# Patient Record
Sex: Female | Born: 1995
Health system: Southern US, Community
[De-identification: ages and names within clinical notes are randomized; demographics above are authoritative.]

## PROBLEM LIST (undated history)

## (undated) DIAGNOSIS — J45909 Unspecified asthma, uncomplicated: Secondary | ICD-10-CM

## (undated) DIAGNOSIS — F32A Depression, unspecified: Secondary | ICD-10-CM

## (undated) DIAGNOSIS — F329 Major depressive disorder, single episode, unspecified: Secondary | ICD-10-CM

## (undated) DIAGNOSIS — F419 Anxiety disorder, unspecified: Secondary | ICD-10-CM

## (undated) HISTORY — DX: Major depressive disorder, single episode, unspecified: F32.9

## (undated) HISTORY — DX: Depression, unspecified: F32.A

## (undated) HISTORY — DX: Anxiety disorder, unspecified: F41.9

## (undated) HISTORY — DX: Unspecified asthma, uncomplicated: J45.909

---

## 2016-03-26 ENCOUNTER — Ambulatory Visit (INDEPENDENT_AMBULATORY_CARE_PROVIDER_SITE_OTHER): Payer: 59 | Admitting: Family Medicine

## 2016-03-26 VITALS — BP 130/80 | HR 83 | Temp 98.4°F | Resp 16 | Ht 68.0 in | Wt 211.0 lb

## 2016-03-26 DIAGNOSIS — Z6832 Body mass index (BMI) 32.0-32.9, adult: Secondary | ICD-10-CM

## 2016-03-26 DIAGNOSIS — F329 Major depressive disorder, single episode, unspecified: Secondary | ICD-10-CM | POA: Insufficient documentation

## 2016-03-26 DIAGNOSIS — F321 Major depressive disorder, single episode, moderate: Secondary | ICD-10-CM | POA: Diagnosis not present

## 2016-03-26 DIAGNOSIS — Z113 Encounter for screening for infections with a predominantly sexual mode of transmission: Secondary | ICD-10-CM

## 2016-03-26 DIAGNOSIS — F411 Generalized anxiety disorder: Secondary | ICD-10-CM

## 2016-03-26 DIAGNOSIS — Z114 Encounter for screening for human immunodeficiency virus [HIV]: Secondary | ICD-10-CM | POA: Diagnosis not present

## 2016-03-26 DIAGNOSIS — E6609 Other obesity due to excess calories: Secondary | ICD-10-CM | POA: Diagnosis not present

## 2016-03-26 DIAGNOSIS — Z0000 Encounter for general adult medical examination without abnormal findings: Secondary | ICD-10-CM | POA: Diagnosis not present

## 2016-03-26 DIAGNOSIS — N898 Other specified noninflammatory disorders of vagina: Secondary | ICD-10-CM

## 2016-03-26 DIAGNOSIS — F32A Depression, unspecified: Secondary | ICD-10-CM | POA: Insufficient documentation

## 2016-03-26 LAB — LIPID PANEL
CHOL/HDL RATIO: 3 ratio (ref <=5.0)
CHOLESTEROL: 163 mg/dL (ref 125–200)
HDL: 55 mg/dL (ref >=46)
LDL Cholesterol: 87 mg/dL (ref <130)
Triglycerides: 104 mg/dL (ref <150)
VLDL: 21 mg/dL (ref <30)

## 2016-03-26 LAB — CBC
HEMATOCRIT: 41.4 % (ref 35.0–45.0)
HEMOGLOBIN: 14.2 g/dL (ref 11.7–15.5)
MCH: 28.6 pg (ref 27.0–33.0)
MCHC: 34.3 g/dL (ref 32.0–36.0)
MCV: 83.5 fL (ref 80.0–100.0)
MPV: 9.1 fL (ref 7.5–12.5)
Platelets: 259 10*3/uL (ref 140–400)
RBC: 4.96 MIL/uL (ref 3.80–5.10)
RDW: 12.7 % (ref 11.0–15.0)
WBC: 7.3 10*3/uL (ref 3.8–10.8)

## 2016-03-26 LAB — POCT WET + KOH PREP
TRICH BY WET PREP: ABSENT
Yeast by KOH: ABSENT
Yeast by wet prep: ABSENT

## 2016-03-26 LAB — TSH: TSH: 0.77 mIU/L

## 2016-03-26 MED ORDER — METRONIDAZOLE 500 MG PO TABS: 500.0000 mg | ORAL_TABLET | Freq: Two times a day (BID) | ORAL | 0 refills | Status: DC

## 2016-03-26 NOTE — Patient Instructions (Addendum)
   IF you received an x-ray today, you will receive an invoice from [facility]. Please contact  Radiology at [phone] with questions or concerns regarding your invoice.   IF you received labwork today, you will receive an invoice from [facility]. Please contact [facility] at [phone] with questions or concerns regarding your invoice.   Our billing staff will not be able to assist you with questions regarding bills from these companies.  You will be contacted with the lab results as soon as they are available. The fastest way to get your results is to activate your My Chart account. Instructions are located on the last page of this paperwork. If you have not heard from us regarding the results in 2 weeks, please contact this office.     Health Maintenance, Female Adopting a healthy lifestyle and getting preventive care can go a long way to promote health and wellness. Talk with your health care provider about what schedule of regular examinations is right for you. This is a good chance for you to check in with your provider about disease prevention and staying healthy. In between checkups, there are plenty of things you can do on your own. Experts have done a lot of research about which lifestyle changes and preventive measures are most likely to keep you healthy. Ask your health care provider for more information. WEIGHT AND DIET  Eat a healthy diet  Be sure to include plenty of vegetables, fruits, low-fat dairy products, and lean protein.  Do not eat a lot of foods high in solid fats, added sugars, or salt.  Get regular exercise. This is one of the most important things you can do for your health.  Most adults should exercise for at least 150 minutes each week. The exercise should increase your heart rate and make you sweat (moderate-intensity exercise).  Most adults should also do strengthening exercises at least twice a week. This  is in addition to the moderate-intensity exercise.  Maintain a healthy weight  Body mass index (BMI) is a measurement that can be used to identify possible weight problems. It estimates body fat based on height and weight. Your health care provider can help determine your BMI and help you achieve or maintain a healthy weight.  For females 20 years of age and older:   A BMI below 18.5 is considered underweight.  A BMI of 18.5 to 24.9 is normal.  A BMI of 25 to 29.9 is considered overweight.  A BMI of 30 and above is considered obese.  Watch levels of cholesterol and blood lipids  You should start having your blood tested for lipids and cholesterol at 22 years of age, then have this test every 5 years.  You may need to have your cholesterol levels checked more often if:  Your lipid or cholesterol levels are high.  You are older than 22 years of age.  You are at high risk for heart disease.  CANCER SCREENING   Lung Cancer  Lung cancer screening is recommended for adults 55-80 years old who are at high risk for lung cancer because of a history of smoking.  A yearly low-dose CT scan of the lungs is recommended for people who:  Currently smoke.  Have quit within the past 15 years.  Have at least a 30-pack-year history of smoking. A pack year is smoking an average of one pack of cigarettes a day for 1 year.  Yearly screening should continue until it has been   15 years since you quit.  Yearly screening should stop if you develop a health problem that would prevent you from having lung cancer treatment.  Breast Cancer  Practice breast self-awareness. This means understanding how your breasts normally appear and feel.  It also means doing regular breast self-exams. Let your health care provider know about any changes, no matter how small.  If you are in your 20s or 30s, you should have a clinical breast exam (CBE) by a health care provider every 1-3 years as part of a  regular health exam.  If you are 40 or older, have a CBE every year. Also consider having a breast X-ray (mammogram) every year.  If you have a family history of breast cancer, talk to your health care provider about genetic screening.  If you are at high risk for breast cancer, talk to your health care provider about having an MRI and a mammogram every year.  Breast cancer gene (BRCA) assessment is recommended for women who have family members with BRCA-related cancers. BRCA-related cancers include:  Breast.  Ovarian.  Tubal.  Peritoneal cancers.  Results of the assessment will determine the need for genetic counseling and BRCA1 and BRCA2 testing. Cervical Cancer Your health care provider may recommend that you be screened regularly for cancer of the pelvic organs (ovaries, uterus, and vagina). This screening involves a pelvic examination, including checking for microscopic changes to the surface of your cervix (Pap test). You may be encouraged to have this screening done every 3 years, beginning at age 21.  For women ages 30-65, health care providers may recommend pelvic exams and Pap testing every 3 years, or they may recommend the Pap and pelvic exam, combined with testing for human papilloma virus (HPV), every 5 years. Some types of HPV increase your risk of cervical cancer. Testing for HPV may also be done on women of any age with unclear Pap test results.  Other health care providers may not recommend any screening for nonpregnant women who are considered low risk for pelvic cancer and who do not have symptoms. Ask your health care provider if a screening pelvic exam is right for you.  If you have had past treatment for cervical cancer or a condition that could lead to cancer, you need Pap tests and screening for cancer for at least 20 years after your treatment. If Pap tests have been discontinued, your risk factors (such as having a new sexual partner) need to be reassessed to  determine if screening should resume. Some women have medical problems that increase the chance of getting cervical cancer. In these cases, your health care provider may recommend more frequent screening and Pap tests. Colorectal Cancer  This type of cancer can be detected and often prevented.  Routine colorectal cancer screening usually begins at 22 years of age and continues through 22 years of age.  Your health care provider may recommend screening at an earlier age if you have risk factors for colon cancer.  Your health care provider may also recommend using home test kits to check for hidden blood in the stool.  A small camera at the end of a tube can be used to examine your colon directly (sigmoidoscopy or colonoscopy). This is done to check for the earliest forms of colorectal cancer.  Routine screening usually begins at age 50.  Direct examination of the colon should be repeated every 5-10 years through 22 years of age. However, you may need to be screened more often if   early forms of precancerous polyps or small growths are found. Skin Cancer  Check your skin from head to toe regularly.  Tell your health care provider about any new moles or changes in moles, especially if there is a change in a mole's shape or color.  Also tell your health care provider if you have a mole that is larger than the size of a pencil eraser.  Always use sunscreen. Apply sunscreen liberally and repeatedly throughout the day.  Protect yourself by wearing long sleeves, pants, a wide-brimmed hat, and sunglasses whenever you are outside. HEART DISEASE, DIABETES, AND HIGH BLOOD PRESSURE   High blood pressure causes heart disease and increases the risk of stroke. High blood pressure is more likely to develop in:  People who have blood pressure in the high end of the normal range (130-139/85-89 mm Hg).  People who are overweight or obese.  People who are African American.  If you are 18-39 years of  age, have your blood pressure checked every 3-5 years. If you are 40 years of age or older, have your blood pressure checked every year. You should have your blood pressure measured twice--once when you are at a hospital or clinic, and once when you are not at a hospital or clinic. Record the average of the two measurements. To check your blood pressure when you are not at a hospital or clinic, you can use:  An automated blood pressure machine at a pharmacy.  A home blood pressure monitor.  If you are between 55 years and 79 years old, ask your health care provider if you should take aspirin to prevent strokes.  Have regular diabetes screenings. This involves taking a blood sample to check your fasting blood sugar level.  If you are at a normal weight and have a low risk for diabetes, have this test once every three years after 22 years of age.  If you are overweight and have a high risk for diabetes, consider being tested at a younger age or more often. PREVENTING INFECTION  Hepatitis B  If you have a higher risk for hepatitis B, you should be screened for this virus. You are considered at high risk for hepatitis B if:  You were born in a country where hepatitis B is common. Ask your health care provider which countries are considered high risk.  Your parents were born in a high-risk country, and you have not been immunized against hepatitis B (hepatitis B vaccine).  You have HIV or AIDS.  You use needles to inject street drugs.  You live with someone who has hepatitis B.  You have had sex with someone who has hepatitis B.  You get hemodialysis treatment.  You take certain medicines for conditions, including cancer, organ transplantation, and autoimmune conditions. Hepatitis C  Blood testing is recommended for:  Everyone born from 1945 through 1965.  Anyone with known risk factors for hepatitis C. Sexually transmitted infections (STIs)  You should be screened for sexually  transmitted infections (STIs) including gonorrhea and chlamydia if:  You are sexually active and are younger than 22 years of age.  You are older than 22 years of age and your health care provider tells you that you are at risk for this type of infection.  Your sexual activity has changed since you were last screened and you are at an increased risk for chlamydia or gonorrhea. Ask your health care provider if you are at risk.  If you do not have HIV, but   are at risk, it may be recommended that you take a prescription medicine daily to prevent HIV infection. This is called pre-exposure prophylaxis (PrEP). You are considered at risk if:  You are sexually active and do not regularly use condoms or know the HIV status of your partner(s).  You take drugs by injection.  You are sexually active with a partner who has HIV. Talk with your health care provider about whether you are at high risk of being infected with HIV. If you choose to begin PrEP, you should first be tested for HIV. You should then be tested every 3 months for as long as you are taking PrEP.  PREGNANCY   If you are premenopausal and you may become pregnant, ask your health care provider about preconception counseling.  If you may become pregnant, take 400 to 800 micrograms (mcg) of folic acid every day.  If you want to prevent pregnancy, talk to your health care provider about birth control (contraception). OSTEOPOROSIS AND MENOPAUSE   Osteoporosis is a disease in which the bones lose minerals and strength with aging. This can result in serious bone fractures. Your risk for osteoporosis can be identified using a bone density scan.  If you are 57 years of age or older, or if you are at risk for osteoporosis and fractures, ask your health care provider if you should be screened.  Ask your health care provider whether you should take a calcium or vitamin D supplement to lower your risk for osteoporosis.  Menopause may have  certain physical symptoms and risks.  Hormone replacement therapy may reduce some of these symptoms and risks. Talk to your health care provider about whether hormone replacement therapy is right for you.  HOME CARE INSTRUCTIONS   Schedule regular health, dental, and eye exams.  Stay current with your immunizations.   Do not use any tobacco products including cigarettes, chewing tobacco, or electronic cigarettes.  If you are pregnant, do not drink alcohol.  If you are breastfeeding, limit how much and how often you drink alcohol.  Limit alcohol intake to no more than 1 drink per day for nonpregnant women. One drink equals 12 ounces of beer, 5 ounces of wine, or 1 ounces of hard liquor.  Do not use street drugs.  Do not share needles.  Ask your health care provider for help if you need support or information about quitting drugs.  Tell your health care provider if you often feel depressed.  Tell your health care provider if you have ever been abused or do not feel safe at home.   This information is not intended to replace advice given to you by your health care provider. Make sure you discuss any questions you have with your health care provider.   Document Released: 12/20/2010 Document Revised: 06/27/2014 Document Reviewed: 05/08/2013 Elsevier Interactive Patient Education 2016 [employer]. Suicidal Feelings: How to Help Yourself Suicide is the taking of one's own life. If you feel as though life is getting too tough to handle and are thinking about suicide, get help right away. To get help:  Call your local emergency services (911 in the [place]  Call a suicide hotline to speak with a trained counselor who understands how you are feeling. The following is a list of suicide hotlines in the [place]. For a list of hotlines in [place], visit FindSkins.pl.  1-800-273-TALK (754)539-2887).  1-800-SUICIDE  516-589-2039).  364-323-2648. This is a hotline for Spanish speakers.  9-211-941-7EYC 832-658-1944). This is a hotline  for TTY users.  1-866-4-U-TREVOR (331) 806-3521). This is a hotline for lesbian, gay, bisexual, transgender, or questioning youth.  Contact a crisis center or a local suicide prevention center. To find a crisis center or suicide prevention center:  Call your local hospital, clinic, community service organization, mental health center, social service provider, or health department. Ask for assistance in connecting to a crisis center.  Visit BankingRep.com.au for a list of crisis centers in the [place], or visit www.suicideprevention.ca/thinking-about-suicide/find-a-crisis-centre for a list of centers in [place].  Visit the following websites:  National Suicide Prevention Lifeline: www.suicidepreventionlifeline.org  Hopeline: www.hopeline.[place] for Suicide Prevention: PromotionalLoans.co.za  [employer] (for lesbian, gay, bisexual, transgender, or questioning youth): www.thetrevorproject.org HOW CAN I HELP MYSELF FEEL BETTER?  Promise yourself that you will not do anything drastic when you have suicidal feelings. Remember, there is hope. Many people have gotten through suicidal thoughts and feelings, and you will, too. You may have gotten through them before, and this proves that you can get through them again.  Let family, friends, teachers, or counselors know how you are feeling. Try not to isolate yourself from those who care about you. Remember, they will want to help you. Talk with someone every day, even if you do not feel sociable. Face-to-face conversation is best.  Call a mental health professional and see one regularly.  Visit your primary health care provider every year.  Eat a well-balanced diet, and space your meals so you eat regularly.  Get plenty of rest.  Avoid alcohol and  drugs, and remove them from your home. They will only make you feel worse.  If you are thinking of taking a lot of medicine, give your medicine to someone who can give it to you one day at a time. If you are on antidepressants and are concerned you will overdose, let your health care provider know so he or she can give you safer medicines. Ask your mental health professional about the possible side effects of any medicines you are taking.  Remove weapons, poisons, knives, and anything else that could harm you from your home.  Try to stick to routines. Follow a schedule every day. Put self-care on your schedule.  Make a list of realistic goals, and cross them off when you achieve them. Accomplishments give a sense of worth.  Wait until you are feeling better before doing the things you find difficult or unpleasant.  Exercise if you are able. You will feel better if you exercise for even a half hour each day.  Go out in the sun or into nature. This will help you recover from depression faster. If you have a favorite place to walk, go there.  Do the things that have always given you pleasure. Play your favorite music, read a good book, paint a picture, play your favorite instrument, or do anything else that takes your mind off your depression if it is safe to do.  Keep your living space well lit.  When you are feeling well, write yourself a letter about tips and support that you can read when you are not feeling well.  Remember that life's difficulties can be sorted out with help. Conditions can be treated. You can work on thoughts and strategies that serve you well.   This information is not intended to replace advice given to you by your health care provider. Make sure you discuss any questions you have with your health care provider.   Document Released: 12/11/2002 Document  Revised: 06/27/2014 Document Reviewed: 10/01/2013 Elsevier Interactive Patient Education [employer].

## 2016-03-26 NOTE — Progress Notes (Signed)
Chief Complaint  Patient presents with  . Annual Exam    pap  . depression screen    pos    Subjective:  Julia Huang is a 20 y.o. female here for a health maintenance visit.  Patient is established pt Additional concerns addressed today:  Depression Reports long standing history of irritability Her parents have a history of anxiety and depression with bipolar disorder in her father with anxiety with her mother.  She reports that she gets suicidal thoughts that she handles by staying close to family for that time. She does not have active suicidal thoughts.  She has not addressed this before.   HPI   Patient Active Problem List   Diagnosis Date Noted  . Generalized anxiety disorder 03/26/2016  . Depression 03/26/2016    Past Medical History:  Diagnosis Date  . Anxiety   . Asthma   . Depression     No past surgical history on file.    No outpatient prescriptions prior to visit.   No facility-administered medications prior to visit.     No Known Allergies   Family History  Problem Relation Age of Onset  . Mental illness Mother   . Diabetes Father   . Hyperlipidemia Father   . Mental illness Father      Health Habits: Dental Exam: not up to date Eye Exam: up to date Exercise: 0 times/week on average Current exercise activities: none Diet: balances cooking at home with eating out  Social History   Social History  . Marital status: Married    Spouse name: N/A  . Number of children: N/A  . Years of education: N/A   Occupational History  . Not on file.   Social History Main Topics  . Smoking status: Never Smoker  . Smokeless tobacco: Not on file  . Alcohol use Not on file  . Drug use: Unknown  . Sexual activity: Not on file   Other Topics Concern  . Not on file   Social History Narrative  . No narrative on file   History  Alcohol use Not on file   History  Smoking Status  . Never Smoker  Smokeless Tobacco  . Not on file    History  Drug use: Unknown    GYN: Sexual Health Menstrual status: regular menses LMP: Patient's last menstrual period was 03/08/2016 (approximate). Last pap smear: 2016 History of abnormal pap smears: no  Sexually active: yes with female partner Current contraception: condoms  Health Maintenance: See under health Maintenance activity for review of completion dates as well.  There is no immunization history on file for this patient.  Health Maintenance reviewed    Depression Screen-PHQ2/9 Depression screen [facility] 2/9 03/26/2016  Decreased Interest 1  Down, Depressed, Hopeless 3  PHQ - 2 Score 4  Altered sleeping 1  Tired, decreased energy 3  Change in appetite 0  Feeling bad or failure about yourself  2  Trouble concentrating 3  Moving slowly or fidgety/restless 1  Suicidal thoughts 1  PHQ-9 Score 15     Denies active plan Reports that she does not get thoughts daily  Depression Severity and Treatment Recommendations:  0-4= None  5-9= Mild / Treatment: Support, educate to call if worse; return in one month  10-14= Moderate / Treatment: Support, watchful waiting; Antidepressant or Psycotherapy  15-19= Moderately severe / Treatment: Antidepressant OR Psychotherapy  >= 20 = Major depression, severe / Antidepressant AND Psychotherapy    Review of Systems   ROS  See HPI as well for ROS   Objective:   Vitals:   03/26/16 1411  BP: 130/80  Pulse: 83  Resp: 16  Temp: 98.4 F (36.9 C)  TempSrc: Oral  SpO2: 96%  Weight: 211 lb (95.7 kg)  Height: _0  (1.727 m)   Body mass index is 32.08 kg/m.  Physical Exam  Constitutional: She is oriented to person, place, and time. She appears well-developed and well-nourished.  HENT:  Head: Normocephalic and atraumatic.  Right Ear: External ear normal.  Left Ear: External ear normal.  Eyes: Conjunctivae are normal. Pupils are equal, round, and reactive to light.  Cardiovascular: Normal rate, regular rhythm and  normal heart sounds.   No murmur heard. Pulmonary/Chest: Effort normal and breath sounds normal. No respiratory distress. She has no wheezes.  Abdominal: Soft. Bowel sounds are normal. She exhibits no distension. There is no tenderness. There is no rebound.  Genitourinary: Uterus normal. Vaginal discharge found.  Musculoskeletal: Normal range of motion. She exhibits no edema.  Neurological: She is alert and oriented to person, place, and time. No cranial nerve deficit.  Skin: Skin is warm. She is not diaphoretic. No erythema.  Psychiatric: She has a normal mood and affect. Her behavior is normal. Thought content normal.      Assessment/Plan:   Patient was seen for a health maintenance exam.  Counseled the patient on health maintenance issues. Reviewed her health mainteance schedule and ordered appropriate tests (see orders.) Counseled on regular exercise and weight management. Recommend regular eye exams and dental cleaning.   The following issues were addressed today for health maintenance: Screening for cervical cancer - advised pt to get pap smear next year Contraception - advised pt to follow up if she decides to get other options for contraception Vaccinations - HPV reportedly uptodate, declined flu vaccine   Julia Huang was seen today for annual exam and depression screen.  Diagnoses and all orders for this visit:  Moderate single current episode of major depressive disorder ([place])- new diagnosis, referral to [place] suicide hotline Passive suicidal ideation at this time Will follow up with patient in one month Will screen for underlying metabolic causes -     CBC -     TSH  Generalized anxiety disorder - pt with elements of anxiety as well Given her family history of bipolar disorder in her father evaluation by [facility] will help to clarify her status  Screen for STD (sexually transmitted disease) -     GC/Chlamydia Probe Amp -     HIV antibody  Encounter for  screening for HIV- verbal consent given for HIV screening  Health maintenance examination- will check for dyslipidemia based on BMI, anemia and thyroid disease -     CBC -     TSH -     Lipid panel  Class 1 obesity due to excess calories with serious comorbidity and body mass index (BMI) of 32.0 to 32.9 in adult- discussed exercise 5 times a week and dietary changes such as limiting sweet beverages -     Lipid panel  Vaginal discharge- consistent with BV GC/Ch pending Treated with Flagyl and probiotic Result of wet prep reviewed in the office -     GC/Chlamydia Probe Amp -     POCT Wet + KOH Prep      Return in about 4 weeks (around 04/23/2016) for depression follow up.  No future appointments.  Body mass index is 32.08 kg/m.:  Discussed the patient's BMI with patient.  The BMI above normal - obese range  Patient Instructions       IF you received an x-ray today, you will receive an invoice from [facility]. Please contact [facility] at [phone] with questions or concerns regarding your invoice.   IF you received labwork today, you will receive an invoice from [employer]. Please contact [facility] at [phone] with questions or concerns regarding your invoice.   Our billing staff will not be able to assist you with questions regarding bills from these companies.  You will be contacted with the lab results as soon as they are available. The fastest way to get your results is to activate your My Chart account. Instructions are located on the last page of this paperwork. If you have not heard from [place] regarding the results in 2 weeks, please contact this office.     Health Maintenance, Female Adopting a healthy lifestyle and getting preventive care can go a long way to promote health and wellness. Talk with your health care provider about what schedule of regular examinations is right for you. This is a good chance for you to  check in with your provider about disease prevention and staying healthy. In between checkups, there are plenty of things you can do on your own. Experts have done a lot of research about which lifestyle changes and preventive measures are most likely to keep you healthy. Ask your health care provider for more information. WEIGHT AND DIET  Eat a healthy diet  Be sure to include plenty of vegetables, fruits, low-fat dairy products, and lean protein.  Do not eat a lot of foods high in solid fats, added sugars, or salt.  Get regular exercise. This is one of the most important things you can do for your health.  Most adults should exercise for at least 150 minutes each week. The exercise should increase your heart rate and make you sweat (moderate-intensity exercise).  Most adults should also do strengthening exercises at least twice a week. This is in addition to the moderate-intensity exercise.  Maintain a healthy weight  Body mass index (BMI) is a measurement that can be used to identify possible weight problems. It estimates body fat based on height and weight. Your health care provider can help determine your BMI and help you achieve or maintain a healthy weight.  For females 34 years of age and older:   A BMI below 18.5 is considered underweight.  A BMI of 18.5 to 24.9 is normal.  A BMI of 25 to 29.9 is considered overweight.  A BMI of 30 and above is considered obese.  Watch levels of cholesterol and blood lipids  You should start having your blood tested for lipids and cholesterol at 22 years of age, then have this test every 5 years.  You may need to have your cholesterol levels checked more often if:  Your lipid or cholesterol levels are high.  You are older than 22 years of age.  You are at high risk for heart disease.  CANCER SCREENING   Lung Cancer  Lung cancer screening is recommended for adults 25-60 years old who are at high risk for lung cancer because of a  history of smoking.  A yearly low-dose CT scan of the lungs is recommended for people who:  Currently smoke.  Have quit within the past 15 years.  Have at least a 30-pack-year history of smoking. A pack year is smoking an average of one pack of cigarettes  a day for 1 year.  Yearly screening should continue until it has been 15 years since you quit.  Yearly screening should stop if you develop a health problem that would prevent you from having lung cancer treatment.  Breast Cancer  Practice breast self-awareness. This means understanding how your breasts normally appear and feel.  It also means doing regular breast self-exams. Let your health care provider know about any changes, no matter how small.  If you are in your 20s or 30s, you should have a clinical breast exam (CBE) by a health care provider every 1-3 years as part of a regular health exam.  If you are 13 or older, have a CBE every year. Also consider having a breast X-ray (mammogram) every year.  If you have a family history of breast cancer, talk to your health care provider about genetic screening.  If you are at high risk for breast cancer, talk to your health care provider about having an MRI and a mammogram every year.  Breast cancer gene (BRCA) assessment is recommended for women who have family members with BRCA-related cancers. BRCA-related cancers include:  Breast.  Ovarian.  Tubal.  Peritoneal cancers.  Results of the assessment will determine the need for genetic counseling and BRCA1 and BRCA2 testing. Cervical Cancer Your health care provider may recommend that you be screened regularly for cancer of the pelvic organs (ovaries, uterus, and vagina). This screening involves a pelvic examination, including checking for microscopic changes to the surface of your cervix (Pap test). You may be encouraged to have this screening done every 3 years, beginning at age 22.  For women ages 31-65, health care  providers may recommend pelvic exams and Pap testing every 3 years, or they may recommend the Pap and pelvic exam, combined with testing for human papilloma virus (HPV), every 5 years. Some types of HPV increase your risk of cervical cancer. Testing for HPV may also be done on women of any age with unclear Pap test results.  Other health care providers may not recommend any screening for nonpregnant women who are considered low risk for pelvic cancer and who do not have symptoms. Ask your health care provider if a screening pelvic exam is right for you.  If you have had past treatment for cervical cancer or a condition that could lead to cancer, you need Pap tests and screening for cancer for at least 20 years after your treatment. If Pap tests have been discontinued, your risk factors (such as having a new sexual partner) need to be reassessed to determine if screening should resume. Some women have medical problems that increase the chance of getting cervical cancer. In these cases, your health care provider may recommend more frequent screening and Pap tests. Colorectal Cancer  This type of cancer can be detected and often prevented.  Routine colorectal cancer screening usually begins at 22 years of age and continues through 21 years of age.  Your health care provider may recommend screening at an earlier age if you have risk factors for colon cancer.  Your health care provider may also recommend using home test kits to check for hidden blood in the stool.  A small camera at the end of a tube can be used to examine your colon directly (sigmoidoscopy or colonoscopy). This is done to check for the earliest forms of colorectal cancer.  Routine screening usually begins at age 32.  Direct examination of the colon should be repeated every 5-10 years through 22  years of age. However, you may need to be screened more often if early forms of precancerous polyps or small growths are found. Skin  Cancer  Check your skin from head to toe regularly.  Tell your health care provider about any new moles or changes in moles, especially if there is a change in a mole's shape or color.  Also tell your health care provider if you have a mole that is larger than the size of a pencil eraser.  Always use sunscreen. Apply sunscreen liberally and repeatedly throughout the day.  Protect yourself by wearing long sleeves, pants, a wide-brimmed hat, and sunglasses whenever you are outside. HEART DISEASE, DIABETES, AND HIGH BLOOD PRESSURE   High blood pressure causes heart disease and increases the risk of stroke. High blood pressure is more likely to develop in:  People who have blood pressure in the high end of the normal range (130-139/85-89 mm Hg).  People who are overweight or obese.  People who are African American.  If you are 45-57 years of age, have your blood pressure checked every 3-5 years. If you are 34 years of age or older, have your blood pressure checked every year. You should have your blood pressure measured twice--once when you are at a hospital or clinic, and once when you are not at a hospital or clinic. Record the average of the two measurements. To check your blood pressure when you are not at a hospital or clinic, you can use:  An automated blood pressure machine at a pharmacy.  A home blood pressure monitor.  If you are between 15 years and 20 years old, ask your health care provider if you should take aspirin to prevent strokes.  Have regular diabetes screenings. This involves taking a blood sample to check your fasting blood sugar level.  If you are at a normal weight and have a low risk for diabetes, have this test once every three years after 22 years of age.  If you are overweight and have a high risk for diabetes, consider being tested at a younger age or more often. PREVENTING INFECTION  Hepatitis B  If you have a higher risk for hepatitis B, you should be  screened for this virus. You are considered at high risk for hepatitis B if:  You were born in a country where hepatitis B is common. Ask your health care provider which countries are considered high risk.  Your parents were born in a high-risk country, and you have not been immunized against hepatitis B (hepatitis B vaccine).  You have HIV or AIDS.  You use needles to inject street drugs.  You live with someone who has hepatitis B.  You have had sex with someone who has hepatitis B.  You get hemodialysis treatment.  You take certain medicines for conditions, including cancer, organ transplantation, and autoimmune conditions. Hepatitis C  Blood testing is recommended for:  Everyone born from [birth year] through 1967.  Anyone with known risk factors for hepatitis C. Sexually transmitted infections (STIs)  You should be screened for sexually transmitted infections (STIs) including gonorrhea and chlamydia if:  You are sexually active and are younger than 22 years of age.  You are older than 22 years of age and your health care provider tells you that you are at risk for this type of infection.  Your sexual activity has changed since you were last screened and you are at an increased risk for chlamydia or gonorrhea. Ask your health care provider  if you are at risk.  If you do not have HIV, but are at risk, it may be recommended that you take a prescription medicine daily to prevent HIV infection. This is called pre-exposure prophylaxis (PrEP). You are considered at risk if:  You are sexually active and do not regularly use condoms or know the HIV status of your partner(s).  You take drugs by injection.  You are sexually active with a partner who has HIV. Talk with your health care provider about whether you are at high risk of being infected with HIV. If you choose to begin PrEP, you should first be tested for HIV. You should then be tested every 3 months for as long as you are taking  PrEP.  PREGNANCY   If you are premenopausal and you may become pregnant, ask your health care provider about preconception counseling.  If you may become pregnant, take 400 to 800 micrograms (mcg) of folic acid every day.  If you want to prevent pregnancy, talk to your health care provider about birth control (contraception). OSTEOPOROSIS AND MENOPAUSE   Osteoporosis is a disease in which the bones lose minerals and strength with aging. This can result in serious bone fractures. Your risk for osteoporosis can be identified using a bone density scan.  If you are 43 years of age or older, or if you are at risk for osteoporosis and fractures, ask your health care provider if you should be screened.  Ask your health care provider whether you should take a calcium or vitamin D supplement to lower your risk for osteoporosis.  Menopause may have certain physical symptoms and risks.  Hormone replacement therapy may reduce some of these symptoms and risks. Talk to your health care provider about whether hormone replacement therapy is right for you.  HOME CARE INSTRUCTIONS   Schedule regular health, dental, and eye exams.  Stay current with your immunizations.   Do not use any tobacco products including cigarettes, chewing tobacco, or electronic cigarettes.  If you are pregnant, do not drink alcohol.  If you are breastfeeding, limit how much and how often you drink alcohol.  Limit alcohol intake to no more than 1 drink per day for nonpregnant women. One drink equals 12 ounces of beer, 5 ounces of wine, or 1 ounces of hard liquor.  Do not use street drugs.  Do not share needles.  Ask your health care provider for help if you need support or information about quitting drugs.  Tell your health care provider if you often feel depressed.  Tell your health care provider if you have ever been abused or do not feel safe at home.   This information is not intended to replace advice given  to you by your health care provider. Make sure you discuss any questions you have with your health care provider.   Document Released: 12/20/2010 Document Revised: 06/27/2014 Document Reviewed: 05/08/2013 Elsevier Interactive Patient Education 2016 [employer]. Suicidal Feelings: How to Help Yourself Suicide is the taking of one's own life. If you feel as though life is getting too tough to handle and are thinking about suicide, get help right away. To get help:  Call your local emergency services (911 in the [place]  Call a suicide hotline to speak with a trained counselor who understands how you are feeling. The following is a list of suicide hotlines in the [place]. For a list of hotlines in [place], visit FindSkins.pl.  1-800-273-TALK 423-510-8388).  1-800-SUICIDE 661-369-0682).  631-316-6454. This  is a hotline for Spanish speakers.  2-620-355-9RCB 708-339-2803). This is a hotline for TTY users.  1-866-4-U-TREVOR 617-209-0580). This is a hotline for lesbian, gay, bisexual, transgender, or questioning youth.  Contact a crisis center or a local suicide prevention center. To find a crisis center or suicide prevention center:  Call your local hospital, clinic, community service organization, mental health center, social service provider, or health department. Ask for assistance in connecting to a crisis center.  Visit BankingRep.com.au for a list of crisis centers in the [place], or visit www.suicideprevention.ca/thinking-about-suicide/find-a-crisis-centre for a list of centers in [place].  Visit the following websites:  National Suicide Prevention Lifeline: www.suicidepreventionlifeline.org  Hopeline: www.hopeline.[place] for Suicide Prevention: PromotionalLoans.co.za  The [employer] (for lesbian, gay, bisexual, transgender, or questioning youth):  www.thetrevorproject.org HOW CAN I HELP MYSELF FEEL BETTER?  Promise yourself that you will not do anything drastic when you have suicidal feelings. Remember, there is hope. Many people have gotten through suicidal thoughts and feelings, and you will, too. You may have gotten through them before, and this proves that you can get through them again.  Let family, friends, teachers, or counselors know how you are feeling. Try not to isolate yourself from those who care about you. Remember, they will want to help you. Talk with someone every day, even if you do not feel sociable. Face-to-face conversation is best.  Call a mental health professional and see one regularly.  Visit your primary health care provider every year.  Eat a well-balanced diet, and space your meals so you eat regularly.  Get plenty of rest.  Avoid alcohol and drugs, and remove them from your home. They will only make you feel worse.  If you are thinking of taking a lot of medicine, give your medicine to someone who can give it to you one day at a time. If you are on antidepressants and are concerned you will overdose, let your health care provider know so he or she can give you safer medicines. Ask your mental health professional about the possible side effects of any medicines you are taking.  Remove weapons, poisons, knives, and anything else that could harm you from your home.  Try to stick to routines. Follow a schedule every day. Put self-care on your schedule.  Make a list of realistic goals, and cross them off when you achieve them. Accomplishments give a sense of worth.  Wait until you are feeling better before doing the things you find difficult or unpleasant.  Exercise if you are able. You will feel better if you exercise for even a half hour each day.  Go out in the sun or into nature. This will help you recover from depression faster. If you have a favorite place to walk, go there.  Do the things that have  always given you pleasure. Play your favorite music, read a good book, paint a picture, play your favorite instrument, or do anything else that takes your mind off your depression if it is safe to do.  Keep your living space well lit.  When you are feeling well, write yourself a letter about tips and support that you can read when you are not feeling well.  Remember that life's difficulties can be sorted out with help. Conditions can be treated. You can work on thoughts and strategies that serve you well.   This information is not intended to replace advice given to you by your health care provider. Make sure you discuss any  questions you have with your health care provider.   Document Released: 12/11/2002 Document Revised: 06/27/2014 Document Reviewed: 10/01/2013 Elsevier Interactive Patient Education [employer].

## 2016-03-27 LAB — HIV ANTIBODY (ROUTINE TESTING W REFLEX): HIV: NONREACTIVE

## 2016-03-28 ENCOUNTER — Telehealth: Payer: Self-pay | Admitting: Family Medicine

## 2016-03-28 NOTE — Telephone Encounter (Signed)
Left message that her results were normal except for the wet prep which was discussed in the office. She should call back if she has questions.

## 2016-03-29 LAB — GC/CHLAMYDIA PROBE AMP
CT PROBE, AMP APTIMA: NOT DETECTED
GC Probe RNA: NOT DETECTED

## 2016-04-08 ENCOUNTER — Telehealth: Payer: Self-pay

## 2016-04-08 NOTE — Telephone Encounter (Signed)
Pt is needing something called in for a yeast infection believes the meds from recent office visit is causing this   Best number [phone]

## 2016-04-12 MED ORDER — FLUCONAZOLE 150 MG PO TABS: 150.0000 mg | ORAL_TABLET | Freq: Once | ORAL | 0 refills | Status: AC

## 2016-04-14 ENCOUNTER — Telehealth: Payer: Self-pay

## 2016-04-14 ENCOUNTER — Telehealth: Payer: Self-pay | Admitting: Emergency Medicine

## 2016-04-14 NOTE — Telephone Encounter (Signed)
Spoke with patient - informed her to take Diflucan 1 tablet today and repeat in 3dys if no improvement. Pt decided to take Monistat-3day otc and states, she feels better. Advised to keep prescription in case yeast infection flares up.

## 2017-03-04 ENCOUNTER — Encounter (HOSPITAL_COMMUNITY): Payer: Self-pay | Admitting: Nurse Practitioner

## 2017-03-04 ENCOUNTER — Emergency Department (HOSPITAL_COMMUNITY)
Admission: EM | Admit: 2017-03-04 | Discharge: 2017-03-04 | Disposition: A | Discharge status: Home / Self Care | Payer: 59 | Attending: Emergency Medicine | Admitting: Emergency Medicine

## 2017-03-04 DIAGNOSIS — K0889 Other specified disorders of teeth and supporting structures: Secondary | ICD-10-CM | POA: Diagnosis present

## 2017-03-04 MED ORDER — IBUPROFEN 600 MG PO TABS: 600.0000 mg | ORAL_TABLET | Freq: Four times a day (QID) | ORAL | 0 refills | Status: DC | PRN

## 2017-03-04 MED ORDER — ACETAMINOPHEN 500 MG PO TABS: 500.0000 mg | ORAL_TABLET | Freq: Four times a day (QID) | ORAL | 0 refills | Status: DC | PRN

## 2017-03-04 MED ORDER — PENICILLIN V POTASSIUM 500 MG PO TABS: 500.0000 mg | ORAL_TABLET | Freq: Four times a day (QID) | ORAL | 0 refills | Status: AC

## 2017-03-04 NOTE — ED Triage Notes (Signed)
Pt is c/o of left lower molar dental related pain and mild swelling. Denies any symptoms as to suggest active infection.

## 2017-03-04 NOTE — Discharge Instructions (Signed)
Medications: Penicillin, ibuprofen, Tylenol  Treatment: Take penicillin 4 times daily for 7 days. Make sure to finish this medication. You can alternate with ibuprofen and Tylenol every 3 hours or take 1 every 6 hours. Do not combine ibuprofen and naproxen. You can alternate naproxen and Tylenol, however.  Follow-up: Please follow up with your dentist as soon as possible. Please return to the emergency department if you develop any new or worsening symptoms.

## 2017-03-04 NOTE — ED Provider Notes (Signed)
[facility]-EMERGENCY DEPT Provider Note   CSN: [id number] Arrival date & time: 03/04/17  1637     History   Chief Complaint Chief Complaint  Patient presents with  . Dental Pain    HPI Julia Huang is a 21 y.o. female who presents with a three-day history of left lower dental pain. Patient reports she has had pain in that area before, however returned 3 days ago. It was initially manageable with an over-the-counter numbing medicine, however that stopped working. Patient has also tried over-the-counter naproxen. Patient denies any fevers. She has had worsening pain with eating. She knows she has a decaying tooth in the area, however she is concerned about a wisdom tooth coming in. She does have a dentist appointment in 2 days.  HPI  History reviewed. No pertinent past medical history.  There are no active problems to display for this patient.   History reviewed. No pertinent surgical history.  OB History    No data available       Home Medications    Prior to Admission medications   Medication Sig Start Date End Date Taking? Authorizing Provider  acetaminophen (TYLENOL) 500 MG tablet Take 1 tablet (500 mg total) by mouth every 6 (six) hours as needed. 03/04/17   Emonii Wienke, Waylan Boga, PA-C  ibuprofen (ADVIL,MOTRIN) 600 MG tablet Take 1 tablet (600 mg total) by mouth every 6 (six) hours as needed. 03/04/17   Deepa Barthel, Waylan Boga, PA-C  penicillin v potassium (VEETID) 500 MG tablet Take 1 tablet (500 mg total) by mouth 4 (four) times daily. 03/04/17 03/11/17  Emi Holes, PA-C    Family History History reviewed. No pertinent family history.  Social History Social History  Substance Use Topics  . Smoking status: Never Smoker  . Smokeless tobacco: Never Used  . Alcohol use Yes     Allergies   Patient has no known allergies.   Review of Systems Review of Systems  Constitutional: Negative for fever.  HENT: Positive for dental problem.   Skin: Negative for rash and  wound.     Physical Exam Updated Vital Signs BP (!) 127/91 (BP Location: Right Arm)   Pulse 90   Temp 98.6 F (37 C) (Oral)   Resp 18   LMP 02/02/2017   SpO2 98%   Physical Exam  Constitutional: She appears well-developed and well-nourished. No distress.  HENT:  Head: Normocephalic and atraumatic.  Mouth/Throat: Oropharynx is clear and moist. No trismus in the jaw. No dental abscesses. No oropharyngeal exudate.    No abscess or floor tenderness  Eyes: Pupils are equal, round, and reactive to light. Conjunctivae are normal. Right eye exhibits no discharge. Left eye exhibits no discharge. No scleral icterus.  Neck: Normal range of motion. Neck supple. No thyromegaly present.  Mild L submandibular tenderness, no masses palpated  Cardiovascular: Normal rate, regular rhythm, normal heart sounds and intact distal pulses.  Exam reveals no gallop and no friction rub.   No murmur heard. Pulmonary/Chest: Effort normal and breath sounds normal. No stridor. No respiratory distress. She has no wheezes. She has no rales.  Musculoskeletal: She exhibits no edema.  Lymphadenopathy:    She has no cervical adenopathy.  Neurological: She is alert. Coordination normal.  Skin: Skin is warm and dry. No rash noted. She is not diaphoretic. No pallor.  Psychiatric: She has a normal mood and affect.  Nursing note and vitals reviewed.    ED Treatments / Results  Labs (all labs ordered are listed, but only  abnormal results are displayed) Labs Reviewed - No data to display  EKG  EKG Interpretation None       Radiology No results found.  Procedures Procedures (including critical care time)  Medications Ordered in ED Medications - No data to display   Initial Impression / Assessment and Plan / ED Course  I have reviewed the triage vital signs and the nursing notes.  Pertinent labs & imaging results that were available during my care of the patient were reviewed by me and considered in  my medical decision making (see chart for details).     Patient with dentalgia.  No abscess requiring immediate incision and drainage.  Exam not concerning for Ludwig's angina or pharyngeal abscess.  Will treat with penicillin, ibuprofen, Tylenol. Pt instructed to follow-up with dentist at scheduled appointment in 2 days.  Discussed return precautions. Pt safe for discharge.   Final Clinical Impressions(s) / ED Diagnoses   Final diagnoses:  Pain, dental    New Prescriptions New Prescriptions   ACETAMINOPHEN (TYLENOL) 500 MG TABLET    Take 1 tablet (500 mg total) by mouth every 6 (six) hours as needed.   IBUPROFEN (ADVIL,MOTRIN) 600 MG TABLET    Take 1 tablet (600 mg total) by mouth every 6 (six) hours as needed.   PENICILLIN V POTASSIUM (VEETID) 500 MG TABLET    Take 1 tablet (500 mg total) by mouth 4 (four) times daily.     Emi Holes, PA-C 03/04/17 1720    Rolan Bucco, MD 03/04/17 [phone]

## 2017-03-09 ENCOUNTER — Encounter (HOSPITAL_COMMUNITY): Payer: Self-pay | Admitting: Emergency Medicine

## 2017-03-09 ENCOUNTER — Emergency Department (HOSPITAL_COMMUNITY)
Admission: EM | Admit: 2017-03-09 | Discharge: 2017-03-09 | Disposition: A | Discharge status: Home / Self Care | Payer: 59 | Attending: Emergency Medicine | Admitting: Emergency Medicine

## 2017-03-09 DIAGNOSIS — M533 Sacrococcygeal disorders, not elsewhere classified: Secondary | ICD-10-CM | POA: Diagnosis present

## 2017-03-09 DIAGNOSIS — Z5321 Procedure and treatment not carried out due to patient leaving prior to being seen by health care provider: Secondary | ICD-10-CM | POA: Insufficient documentation

## 2017-03-09 NOTE — ED Triage Notes (Signed)
Pt complaint of tailbone pain without injury; hx of same 3-4 months ago "believe it is from a chair I was sitting in." Took hydrocodone and ibuprofen for pain that is currently taking for mouth infection. Pt verbalizes pain onset 0445 this morning.

## 2017-03-09 NOTE — ED Notes (Signed)
Registration staff handed pt labels to this writer stating pt "gone."

## 2017-03-12 ENCOUNTER — Emergency Department (HOSPITAL_COMMUNITY): Admission: EM | Admit: 2017-03-12 | Discharge: 2017-03-12 | Discharge status: Against Medical Advice | Payer: 59

## 2017-03-14 ENCOUNTER — Encounter (HOSPITAL_COMMUNITY): Payer: Self-pay | Admitting: Emergency Medicine

## 2017-03-14 DIAGNOSIS — L0501 Pilonidal cyst with abscess: Secondary | ICD-10-CM | POA: Insufficient documentation

## 2017-03-14 NOTE — ED Triage Notes (Signed)
Patient c/o abscess in rectum area that has been getting worse over past several days.  Patient denies any drainage or fevers.

## 2017-03-15 ENCOUNTER — Emergency Department (HOSPITAL_COMMUNITY)
Admission: EM | Admit: 2017-03-15 | Discharge: 2017-03-15 | Disposition: A | Discharge status: Home / Self Care | Payer: 59 | Attending: Emergency Medicine | Admitting: Emergency Medicine

## 2017-03-15 DIAGNOSIS — L0501 Pilonidal cyst with abscess: Secondary | ICD-10-CM

## 2017-03-15 MED ORDER — DOXYCYCLINE HYCLATE 100 MG PO CAPS: 100.0000 mg | ORAL_CAPSULE | Freq: Two times a day (BID) | ORAL | 0 refills | Status: DC

## 2017-03-15 MED ORDER — HYDROCODONE-ACETAMINOPHEN 5-325 MG PO TABS: 1.0000 | ORAL_TABLET | Freq: Four times a day (QID) | ORAL | 0 refills | Status: DC | PRN

## 2017-03-15 MED ORDER — HYDROCODONE-ACETAMINOPHEN 5-325 MG PO TABS
2.0000 | ORAL_TABLET | Freq: Once | ORAL | Status: AC
  Administered 2017-03-15: 2 via ORAL
  Filled 2017-03-15: qty 2

## 2017-03-15 MED ORDER — LIDOCAINE-EPINEPHRINE (PF) 2 %-1:200000 IJ SOLN
10.0000 mL | Freq: Once | INTRAMUSCULAR | Status: DC
  Filled 2017-03-15: qty 20

## 2017-03-15 NOTE — ED Provider Notes (Signed)
[facility] Provider Note   CSN: [id number] Arrival date & time: 03/14/17  1741     History   Chief Complaint Chief Complaint  Patient presents with  . Cyst    HPI Julia Huang is a 21 y.o. female.  Patient presents to the emergency department with chief complaint of buttock pain. She reports that she has had the pain for approximately 2 weeks. She reports that it is been gradually worsening. She states that it is painful to walk and to sit. She denies any fevers chills. Denies any nausea or vomiting. She reports a history of abscesses in the past.  She states pain is severe.   The history is provided by the patient. No language interpreter was used.    History reviewed. No pertinent past medical history.  There are no active problems to display for this patient.   History reviewed. No pertinent surgical history.  OB History    No data available       Home Medications    Prior to Admission medications   Medication Sig Start Date End Date Taking? Authorizing Provider  acetaminophen (TYLENOL) 500 MG tablet Take 1 tablet (500 mg total) by mouth every 6 (six) hours as needed. 03/04/17   Law, Waylan Boga, PA-C  ibuprofen (ADVIL,MOTRIN) 600 MG tablet Take 1 tablet (600 mg total) by mouth every 6 (six) hours as needed. 03/04/17   Emi Holes, PA-C    Family History No family history on file.  Social History Social History  Substance Use Topics  . Smoking status: Never Smoker  . Smokeless tobacco: Never Used  . Alcohol use Yes     Allergies   Patient has no known allergies.   Review of Systems Review of Systems  All other systems reviewed and are negative.    Physical Exam Updated Vital Signs BP (!) 139/98 (BP Location: Left Arm)   Pulse (!) 52   Temp 98 F (36.7 C) (Oral)   Resp (!) 22   Ht  (1.727 m)   Wt 86.2 kg (190 lb)   LMP 03/09/2017   SpO2 100%   BMI 28.89 kg/m   Physical Exam  Constitutional: She is oriented to  person, place, and time. No distress.  HENT:  Head: Normocephalic and atraumatic.  Eyes: Pupils are equal, round, and reactive to light. Conjunctivae and EOM are normal.  Neck: No tracheal deviation present.  Cardiovascular: Normal rate.   Pulmonary/Chest: Effort normal. No respiratory distress.  Abdominal: Soft.  Genitourinary:  Genitourinary Comments: Pilonidal abscess, chaperone present  Musculoskeletal: Normal range of motion.  Neurological: She is alert and oriented to person, place, and time.  Skin: Skin is warm and dry. She is not diaphoretic.  Psychiatric: Judgment normal.  Nursing note and vitals reviewed.    ED Treatments / Results  Labs (all labs ordered are listed, but only abnormal results are displayed) Labs Reviewed - No data to display  EKG  EKG Interpretation None       Radiology No results found.  Procedures .Marland KitchenIncision and Drainage Date/Time: 03/15/2017 5:19 AM Performed by: Roxy Horseman Authorized by: Roxy Horseman   Consent:    Consent obtained:  Verbal   Consent given by:  Patient   Risks discussed:  Bleeding, pain and infection   Alternatives discussed:  No treatment Location:    Type:  Pilonidal cyst   Size:  3x3cm   Location: buttock. Pre-procedure details:    Skin preparation:  Betadine Anesthesia (see MAR for  exact dosages):    Anesthesia method:  Local infiltration   Local anesthetic:  Lidocaine 1% WITH epi Procedure type:    Complexity:  Complex Procedure details:    Needle aspiration: no     Incision types:  Single straight   Incision depth:  Subcutaneous   Scalpel blade:  11   Wound management:  Probed and deloculated   Drainage:  Purulent   Drainage amount:  Copious   Wound treatment:  Wound left open   Packing materials:  1/2 in iodoform gauze Post-procedure details:    Patient tolerance of procedure:  Tolerated well, no immediate complications   (including critical care time)  Medications Ordered in  ED Medications  HYDROcodone-acetaminophen (NORCO/VICODIN) 5-325 MG per tablet 2 tablet (not administered)  lidocaine-EPINEPHrine (XYLOCAINE W/EPI) 2 %-1:200000 (PF) injection 10 mL (not administered)     Initial Impression / Assessment and Plan / ED Course  I have reviewed the triage vital signs and the nursing notes.  Pertinent labs & imaging results that were available during my care of the patient were reviewed by me and considered in my medical decision making (see chart for details).     Patient with pilonidal abscess. Vital signs are stable. She will need incision and drainage in the emergency department.  Patient with skin abscess amenable to incision and drainage.  Abscess was not large enough to warrant packing or drain,  wound recheck in 2 days. Encouraged home warm soaks and flushing.  Mild signs of cellulitis is surrounding skin.  Will d/c to home.  No antibiotic therapy is indicated.   Final Clinical Impressions(s) / ED Diagnoses   Final diagnoses:  Pilonidal abscess    New Prescriptions New Prescriptions   DOXYCYCLINE (VIBRAMYCIN) 100 MG CAPSULE    Take 1 capsule (100 mg total) by mouth 2 (two) times daily.   HYDROCODONE-ACETAMINOPHEN (NORCO/VICODIN) 5-325 MG TABLET    Take 1-2 tablets by mouth every 6 (six) hours as needed.     Roxy Horseman, PA-C 03/15/17 Guillermina [place], MD 03/15/17 [phone]

## 2018-04-17 ENCOUNTER — Other Ambulatory Visit: Payer: Self-pay

## 2018-04-17 ENCOUNTER — Emergency Department (HOSPITAL_COMMUNITY)
Admission: EM | Admit: 2018-04-17 | Discharge: 2018-04-17 | Disposition: A | Discharge status: Home / Self Care | Payer: 59 | Attending: Emergency Medicine | Admitting: Emergency Medicine

## 2018-04-17 ENCOUNTER — Encounter (HOSPITAL_COMMUNITY): Payer: Self-pay | Admitting: Emergency Medicine

## 2018-04-17 DIAGNOSIS — L03317 Cellulitis of buttock: Secondary | ICD-10-CM | POA: Diagnosis not present

## 2018-04-17 DIAGNOSIS — L0291 Cutaneous abscess, unspecified: Secondary | ICD-10-CM

## 2018-04-17 DIAGNOSIS — L0231 Cutaneous abscess of buttock: Secondary | ICD-10-CM | POA: Diagnosis not present

## 2018-04-17 MED ORDER — LIDOCAINE-EPINEPHRINE (PF) 2 %-1:200000 IJ SOLN
20.0000 mL | Freq: Once | INTRAMUSCULAR | Status: AC
  Administered 2018-04-17: 20 mL
  Filled 2018-04-17: qty 20

## 2018-04-17 MED ORDER — OXYCODONE-ACETAMINOPHEN 5-325 MG PO TABS
2.0000 | ORAL_TABLET | Freq: Once | ORAL | Status: AC
  Administered 2018-04-17: 2 via ORAL
  Filled 2018-04-17: qty 2

## 2018-04-17 MED ORDER — SULFAMETHOXAZOLE-TRIMETHOPRIM 800-160 MG PO TABS
1.0000 | ORAL_TABLET | Freq: Once | ORAL | Status: AC
  Administered 2018-04-17: 1 via ORAL
  Filled 2018-04-17: qty 1

## 2018-04-17 MED ORDER — SULFAMETHOXAZOLE-TRIMETHOPRIM 800-160 MG PO TABS: 1.0000 | ORAL_TABLET | Freq: Two times a day (BID) | ORAL | 0 refills | Status: DC

## 2018-04-17 MED FILL — SULFAMETHOXAZOLE-TMP DS TAB: 800-160 | 7 days supply | Qty: 14 | Fill #0

## 2018-04-17 NOTE — ED Triage Notes (Signed)
Patient here from home with complaints of tailbone pain after fall yesterday. States that she thinks its her pilonidal cyst. Hx of same.

## 2018-04-17 NOTE — ED Notes (Signed)
Pt to restroom

## 2018-04-17 NOTE — ED Notes (Signed)
ED Provider at bedside. 

## 2018-04-17 NOTE — ED Notes (Signed)
Band-Aid applied to wound

## 2018-04-17 NOTE — ED Provider Notes (Signed)
Emergency Department Provider Note   I have reviewed the triage vital signs and the nursing notes.   HISTORY  Chief Complaint Cyst   HPI Julia Huang is a 22 y.o. female who presents the emergency department today secondary to pain in the tailbone area.  Had a pilonidal abscess in the past and this feels similar to that.  States that started last night and has been progressively worsening since then.  Worse with walking.  Worse with sitting.  Has not take anything at home for symptoms. No other associated or modifying symptoms.    History reviewed. No pertinent past medical history.  There are no active problems to display for this patient.   History reviewed. No pertinent surgical history.  Current Outpatient Rx  . Order #: [id number] Class: Print  . Order #: [id number] Class: Historical Med  . Order #: [id number] Class: Historical Med  . Order #: [id number] Class: Normal    Allergies Patient has no known allergies.  No family history on file.  Social History Social History   Tobacco Use  . Smoking status: Never Smoker  . Smokeless tobacco: Never Used  Substance Use Topics  . Alcohol use: Yes  . Drug use: Yes    Types: Marijuana    Review of Systems  All other systems negative except as documented in the HPI. All pertinent positives and negatives as reviewed in the HPI. ____________________________________________   PHYSICAL EXAM:  VITAL SIGNS: ED Triage Vitals  Enc Vitals Group     BP 04/17/18 1039 113/78     Pulse Rate 04/17/18 1039 73     Resp 04/17/18 1039 19     Temp 04/17/18 1039 98.9 F (37.2 C)     Temp Source 04/17/18 1039 Oral     SpO2 04/17/18 1039 100 %     Weight 04/17/18 1046 190 lb (86.2 kg)     Height 04/17/18 1046 5\' 8"  (1.727 m)    Constitutional: Alert and oriented. Well appearing and in no acute distress. Eyes: Conjunctivae are normal. PERRL. EOMI. Head: Atraumatic. Nose: No congestion/rhinnorhea. Mouth/Throat: Mucous  membranes are moist.  Oropharynx non-erythematous. Neck: No stridor.  No meningeal signs.   Cardiovascular: Normal rate, regular rhythm. Good peripheral circulation. Grossly normal heart sounds.   Respiratory: Normal respiratory effort.  No retractions. Lungs CTAB. Gastrointestinal: Soft and nontender. No distention.  Musculoskeletal: No lower extremity tenderness nor edema. No gross deformities of extremities. Neurologic:  Normal speech and language. No gross focal neurologic deficits are appreciated.  Skin:  Skin is warm, dry and intact. No rash noted. GU: indurated, ttp, warm, erythema to proximal left side of gluteal cleft directly opposed to approximately 1.5 cm fluctuant, tender, erythematous area on right gluteal cleft.   ____________________________________________   PROCEDURES  Procedure(s) performed:   Marland KitchenMarland KitchenIncision and Drainage Date/Time: 04/17/2018 1:02 PM Performed by: Marily Memos, MD Authorized by: Marily Memos, MD   Consent:    Consent obtained:  Verbal   Consent given by:  Patient   Risks discussed:  Bleeding, incomplete drainage and infection   Alternatives discussed:  No treatment Location:    Type:  Cyst   Size:  1.5   Location:  Anogenital   Anogenital location:  Gluteal cleft Pre-procedure details:    Skin preparation:  Betadine Anesthesia (see MAR for exact dosages):    Anesthesia method:  Local infiltration   Local anesthetic:  Lidocaine 1% WITH epi Procedure type:    Complexity:  Simple Procedure details:    Needle aspiration: no  Incision types:  Single straight   Scalpel blade:  11   Wound management:  Irrigated with saline   Drainage:  Bloody and purulent   Drainage amount:  Scant   Packing materials:  None Post-procedure details:    Patient tolerance of procedure:  Tolerated well, no immediate complications  ____________________________________________   INITIAL IMPRESSION / ASSESSMENT AND PLAN / ED COURSE  Suspect abscess with  associated cellulitis. Bactrim/pain meds/I&D.   I&D without much output, likely cellulitis causing pain. Abx for prescriptions.    Pertinent labs & imaging results that were available during my care of the patient were reviewed by me and considered in my medical decision making (see chart for details).  ____________________________________________  FINAL CLINICAL IMPRESSION(S) / ED DIAGNOSES  Final diagnoses:  Abscess  Cellulitis of buttock     MEDICATIONS GIVEN DURING THIS VISIT:  Medications  oxyCODONE-acetaminophen (PERCOCET/ROXICET) 5-325 MG per tablet 2 tablet (2 tablets Oral Given 04/17/18 1128)  lidocaine-EPINEPHrine (XYLOCAINE W/EPI) 2 %-1:200000 (PF) injection 20 mL (20 mLs Infiltration Given 04/17/18 1128)  sulfamethoxazole-trimethoprim (BACTRIM DS,SEPTRA DS) 800-160 MG per tablet 1 tablet (1 tablet Oral Given 04/17/18 1128)     NEW OUTPATIENT MEDICATIONS STARTED DURING THIS VISIT:  Discharge Medication List as of 04/17/2018  1:08 PM    START taking these medications   Details  sulfamethoxazole-trimethoprim (BACTRIM DS,SEPTRA DS) 800-160 MG tablet Take 1 tablet by mouth 2 (two) times daily for 7 days., Starting Tue 04/17/2018, Until Tue 04/24/2018, Normal        Note:  This note was prepared with assistance of Dragon voice recognition software. Occasional wrong-word or sound-a-like substitutions may have occurred due to the inherent limitations of voice recognition software.   Marily Memos, MD 04/17/18 1323

## 2018-04-19 ENCOUNTER — Encounter (HOSPITAL_COMMUNITY): Payer: Self-pay

## 2018-04-19 ENCOUNTER — Emergency Department (HOSPITAL_COMMUNITY)
Admission: EM | Admit: 2018-04-19 | Discharge: 2018-04-19 | Disposition: A | Discharge status: Home / Self Care | Payer: 59 | Attending: Emergency Medicine | Admitting: Emergency Medicine

## 2018-04-19 DIAGNOSIS — L03317 Cellulitis of buttock: Secondary | ICD-10-CM | POA: Diagnosis not present

## 2018-04-19 DIAGNOSIS — L0231 Cutaneous abscess of buttock: Secondary | ICD-10-CM | POA: Diagnosis present

## 2018-04-19 MED ORDER — CLINDAMYCIN HCL 150 MG PO CAPS: 300.0000 mg | ORAL_CAPSULE | Freq: Three times a day (TID) | ORAL | 0 refills | Status: AC

## 2018-04-19 NOTE — Discharge Instructions (Signed)
Please return to the Emergency Department for any new or worsening symptoms or if your symptoms do not improve. Please be sure to follow up with your Primary Care Physician as soon as possible regarding your visit today. If you do not have a Primary Doctor please use the resources below to establish one. Please take the new antibiotic medication clindamycin as prescribed.  Please stop taking your previous antibiotic Bactrim. Please use warm bath sits and warm compresses to help with your symptoms. Please follow-up with your primary care doctor, an urgent care or here at the emergency department in 48-72 hours for recheck of these areas.  If areas do not improve they may need incision and drainage in the future.  Contact a doctor if: You have a fever. Your symptoms do not get better after 1-2 days of treatment. Your bone or joint under the infected area starts to hurt after the skin has healed. Your infection comes back. This can happen in the same area or another area. You have a swollen bump in the infected area. You have new symptoms. You feel ill and also have muscle aches and pains. Get help right away if: Your symptoms get worse. You feel very sleepy. You throw up (vomit) or have watery poop (diarrhea) for a long time. There are red streaks coming from the infected area. Your red area gets larger. Your red area turns darker.  Do not take your medicine if  develop an itchy rash, swelling in your mouth or lips, or difficulty breathing.   RESOURCE GUIDE  Chronic Pain Problems: Contact [facility]  [phone] Patients need to be referred by their primary care doctor.  Insufficient Money for Medicine: Contact United Way:  call "211" or [facility] 5102779832.  No Primary Care Doctor: Call [facility]  [phone] - can help you locate a primary care doctor that  accepts your insurance, provides certain services, etc. Physician Referral Service-  [phone]  Agencies that provide inexpensive medical care: [facility]  [phone] [facility]  [phone] [facility]  [phone] [facility]  [phone] Planned [facility]  [phone] [facility]  [phone]  Medicaid-accepting [facility] Providers: [facility]- [address]  [phone], Mon-Fri 9am-7pm, Sat 9am-1pm [facility]- [address] [place], [address] [place]  [phone] [facility]- [address] [place]  [phone] [facility]- [address]  [phone] Renaye Rakers- [address] [place], [address], [phone]  Only accepts [place] patients after they have their name  applied to their card  Self Pay (no insurance) in [facility]: Sickle Cell Patients: Dr Willey Blade, [facility]  [address] [place], [phone] [facility]- [address] [place]  [phone]       [facility] [place]- [address]       -     [facility]- see information above (Speak to [employer] if you do not have insurance)       -  [facility]- [address] [place], [phone]       -  [facility]- [address],  [phone]       -  [facility]- [address], [phone]       -  Dr Julio Sicks-  [address], [place], [phone]       -  [facility]- [address], [phone]       -  [facility]- [address] [phone], also [address], [phone]       -    [facility]- [address] [place], [phone], 1st & 3rd Saturday   every month, 10am-1pm  1) Find a Doctor and Pay Out of Pocket Although you won't have to find out who is covered by your insurance plan, it is a good idea to ask around and get recommendations. You will then need to call the office and see if the doctor you  have chosen will accept you as a new patient and what types of options they offer for patients who are self-pay. Some doctors offer discounts or will set up payment plans for their patients who do not have insurance, but you will need to ask so you aren't surprised when you get to your appointment.  2) Contact Your Local Health Department Not all health departments have doctors that can see patients for sick visits, but many do, so it is worth a call to see if yours does. If you don't know where your local health department is, you can check in your phone book. The CDC also has a tool to help you locate your state's health department, and many state websites also have listings of all of their local health departments.  3) Find a Walk-in Clinic If your illness is not likely to be very severe or complicated, you may want to try a walk in clinic. These are popping up all over the country in pharmacies, drugstores, and shopping centers. They're usually staffed by nurse practitioners or physician assistants that have been trained to treat common illnesses and complaints. They're usually fairly quick and inexpensive. However, if you have serious medical issues or chronic medical problems, these are probably not your best option  STD Testing [facility] Department of [facility] [place], STD Clinic, [address], [place], phone [phone] or [phone].  Monday - Friday, call for an appointment. [facility] Department of [employer], STD Clinic, [place] [address], [place], phone [phone] or [phone].  Monday - Friday, call for an appointment.  Abuse/Neglect: [facility] Child Abuse Hotline (660)423-2492 [facility] Child Abuse Hotline 504-349-6267 (After Hours)  Emergency Shelter:  [facility] (610)246-0028  Maternity Homes: [facility] 732-229-9573 [facility] [phone]  MRSA Hotline #:    719-086-2926  [facility] Resources  [facility] [place]  United Way [facility] Dept. [address]                 [address]         [address] [place] [address]  [place]                                               Cristobal Goldmann Phone:  [phone]  Phone:  [phone]                   Phone:  [place], [place]- [phone]       -     [facility] in [place], [address]                                  [place] [phone] or [phone] (After Hours)   [place]  Substance Abuse Resources: Alcohol and Drug Services  360-205-1511 [place] 2504862001 [facility] [place] 501 425 3739 Residential & Outpatient Substance Abuse Program  579-007-3980  Psychological Services: [place]  ([phone] [place]  [place], [place]. [address] [place], [place]: [phone] or [phone], [employer]  Dental Assistance  If unable to pay or uninsured, contact:  [facility] or [facility]. to become qualified for the adult dental clinic.  Patients with Medicaid: [facility] [phone] [address], [place] [address], [phone]  If unable to pay, or uninsured, contact [facility] [phone])622-0772) or [employer] 6235274445 in [place], [place] in [facility]) to become qualified for the adult dental clinic   Other [place]- [place], [place], [place], [address], [place], Start, 2nd and 4th Thursday of the month at 6:30am.  10 clients each day  by appointment, can sometimes see walk-in patients if someone does not show for an appointment. [facility]- [address] [place]er [place], [place], [address], [place], [place], [place], [address], [place] Department- [phone] [place] [facility] [phone]

## 2018-04-19 NOTE — ED Provider Notes (Signed)
Medical screening examination/treatment/procedure(s) were conducted as a shared visit with non-physician practitioner(s) and myself.  I personally evaluated the patient during the encounter. Briefly, the patient is a 22 y.o. female with history of recent left buttocks abscess that was drained and patient was started on Bactrim.  She returns due to continued pain and now with redness and pain in the right gluteal cleft.  Patient has redness in both her left and right gluteal cleft but no obvious fluctuance.  No obvious abscess is felt.  Concern for ongoing cellulitis.  Likely too early for any benefit for an I&D.  Bactrim will be switched over to clindamycin.  Given return precautions for wound recheck.  Recommend follow-up with primary care doctor.  Patient discharged from ED in good condition.  This chart was dictated using voice recognition software.  Despite best efforts to proofread,  errors can occur which can change the documentation meaning.    EKG Interpretation None           Virgina Norfolk, DO 04/19/18 1757

## 2018-04-19 NOTE — ED Provider Notes (Signed)
[facility]-EMERGENCY DEPT Provider Note   CSN: [id number] Arrival date & time: 04/19/18  1400     History   Chief Complaint Chief Complaint  Patient presents with  . Abscess    HPI Julia Huang is a 22 y.o. female presenting for left and right buttock pain that has been present for approximately 4 days.  Patient was seen on 04/17/2018 and had incision and drainage of left sided gluteal abscess and placed on Bactrim.  Patient states that she has been compliant with Bactrim medication and presents today for a recheck.  Patient states that her left gluteal abscess has remained unchanged since that time, denies drainage from the area.  Additionally patient states that she has a similar area on her right gluteal cleft across from her left side.  Patient states that both areas are moderately painful, dull pain that is worse with sitting and is constant.  Patient states that the area on her right side is not as severe as her left side.  Patient denies fever, nausea/vomiting, abdominal pain, pain with bowel movement, dysuria, hematuria, diarrhea.  Patient states that she has had pilonidal cysts in the past and is asking for referral for surgical management.Marland Kitchen  HPI  History reviewed. No pertinent past medical history.  There are no active problems to display for this patient.   History reviewed. No pertinent surgical history.   OB History   None      Home Medications    Prior to Admission medications   Medication Sig Start Date End Date Taking? Authorizing Provider  acetaminophen (TYLENOL) 500 MG tablet Take 1 tablet (500 mg total) by mouth every 6 (six) hours as needed. Patient taking differently: Take 1,000 mg by mouth every 6 (six) hours as needed (pain).  03/04/17   Law, Waylan Boga, PA-C  clindamycin (CLEOCIN) 150 MG capsule Take 2 capsules (300 mg total) by mouth 3 (three) times daily for 10 days. 04/19/18 04/29/18  Harlene Salts A, PA-C    escitalopram (LEXAPRO) 10 MG tablet Take 10 mg by mouth daily. 04/04/18   [provider]  omeprazole (PRILOSEC) 20 MG capsule Take 20 mg by mouth daily. 03/24/18   [provider]    Family History Family History  Problem Relation Age of Onset  . Diabetes Father   . Heart failure Father     Social History Social History   Tobacco Use  . Smoking status: Never Smoker  . Smokeless tobacco: Never Used  Substance Use Topics  . Alcohol use: Yes  . Drug use: Yes    Types: Marijuana     Allergies   Patient has no known allergies.   Review of Systems Review of Systems  Constitutional: Negative.  Negative for chills and fever.  Gastrointestinal: Negative.  Negative for abdominal pain, blood in stool, diarrhea, nausea and vomiting.       Denies rectal pain with bowel movements  Skin: Positive for color change and wound.     Physical Exam Updated Vital Signs BP 126/85 (BP Location: Left Arm)   Pulse 88   Temp 98.4 F (36.9 C) (Oral)   Resp 16   Ht 5\' 8"  (1.727 m)   Wt 86.2 kg   LMP 03/27/2018   SpO2 95%   BMI 28.89 kg/m   Physical Exam  Constitutional: She is oriented to person, place, and time. She appears well-developed and well-nourished. No distress.  HENT:  Head: Normocephalic and atraumatic.  Right Ear: External ear normal.  Left Ear: External ear normal.  Nose: Nose normal.  Eyes: Pupils are equal, round, and reactive to light. EOM are normal.  Neck: Trachea normal and normal range of motion. No tracheal deviation present.  Pulmonary/Chest: Effort normal. No respiratory distress.  Abdominal: Soft. There is no tenderness. There is no rebound and no guarding.  Musculoskeletal: Normal range of motion.  Neurological: She is alert and oriented to person, place, and time.  Skin: Skin is warm and dry.     Examination chaperoned by Marlise Eves.  Patient with 2 small areas of induration to top of gluteal cleft on either side.  Both areas with  some induration however no fluctuance.  Cellulitis surrounding both areas.  Area does not appear to extend towards her rectum.  Neither area appears to have extension towards the tailbone.  Psychiatric: She has a normal mood and affect. Her behavior is normal.    ED Treatments / Results  Labs (all labs ordered are listed, but only abnormal results are displayed) Labs Reviewed - No data to display  EKG None  Radiology No results found.  Procedures Procedures (including critical care time)  Medications Ordered in ED Medications - No data to display   Initial Impression / Assessment and Plan / ED Course  I have reviewed the triage vital signs and the nursing notes.  Pertinent labs & imaging results that were available during my care of the patient were reviewed by me and considered in my medical decision making (see chart for details).  Clinical Course as of Apr 19 1758  Thu Apr 19, 2018  1754 Patient seen and evaluated by Dr. Lockie Mola who advises antibiotic changed to clindamycin 300 mg 3 times daily x10 days and follow-up.   [BM]    Clinical Course User Index [BM] Bill Salinas, PA-C   22 year old otherwise healthy female today presenting for recheck of her previously drained left gluteal cleft abscess.  Patient states that symptoms have not improved following Bactrim and states that the area to her right gluteal cleft is also causing her pain today.  Both areas are small and with some induration however no clear fluctuance or pocket is present.  Do not believe that further incision and drainage today would be beneficial for the patient.  I agree that patient's pain today is likely from cellulitis.  No rectal or tailbone involvement.  Patient seen and evaluated by Dr. Lockie Mola; please see his note; Dr. Lockie Mola advises abx switch from Bactrim to clindamycin.  Informed to follow-up for wound recheck in 2-3 days.  Informed that if symptoms do not improve that she may need  incision and drainage in the future however no identifiable drainable abscess at this time.  Patient afebrile, not tachycardic, not hypotensive well-appearing and in no acute distress.  Patient does not meet SIRS criteria.  At this time there does not appear to be any evidence of an acute emergency medical condition and the patient appears stable for discharge with appropriate outpatient follow up. Diagnosis was discussed with patient who verbalizes understanding of care plan and is agreeable to discharge. I have discussed return precautions with patient and family at bedside who verbalize understanding of return precautions. Patient strongly encouraged to follow-up with their PCP. All questions answered.    Note: Portions of this report may have been transcribed using voice recognition software. Every effort was made to ensure accuracy; however, inadvertent computerized transcription errors may still be present.  Final Clinical Impressions(s) / ED Diagnoses   Final  diagnoses:  Cellulitis of buttock    ED Discharge Orders         Ordered    clindamycin (CLEOCIN) 150 MG capsule  3 times daily     04/19/18 1755           Bill Salinas, PA-C 04/19/18 1802    Virgina Norfolk, DO 04/20/18 0117

## 2018-04-19 NOTE — ED Triage Notes (Signed)
Patient reports that she has an abscess to the right buttock area x 4 days.

## 2019-04-29 ENCOUNTER — Other Ambulatory Visit: Payer: Self-pay

## 2019-04-29 ENCOUNTER — Encounter (HOSPITAL_COMMUNITY): Payer: Self-pay

## 2019-04-29 ENCOUNTER — Inpatient Hospital Stay (HOSPITAL_COMMUNITY)
Admission: AD | Admit: 2019-04-29 | Discharge: 2019-04-29 | Disposition: A | Discharge status: Home / Self Care | Payer: 59 | Attending: Obstetrics and Gynecology | Admitting: Obstetrics and Gynecology

## 2019-04-29 ENCOUNTER — Inpatient Hospital Stay (HOSPITAL_COMMUNITY): Payer: 59

## 2019-04-29 ENCOUNTER — Encounter (HOSPITAL_COMMUNITY): Payer: Self-pay | Admitting: *Deleted

## 2019-04-29 DIAGNOSIS — F329 Major depressive disorder, single episode, unspecified: Secondary | ICD-10-CM | POA: Insufficient documentation

## 2019-04-29 DIAGNOSIS — O99341 Other mental disorders complicating pregnancy, first trimester: Secondary | ICD-10-CM | POA: Insufficient documentation

## 2019-04-29 DIAGNOSIS — Z79899 Other long term (current) drug therapy: Secondary | ICD-10-CM | POA: Diagnosis not present

## 2019-04-29 DIAGNOSIS — Z8249 Family history of ischemic heart disease and other diseases of the circulatory system: Secondary | ICD-10-CM | POA: Insufficient documentation

## 2019-04-29 DIAGNOSIS — Z3A01 Less than 8 weeks gestation of pregnancy: Secondary | ICD-10-CM | POA: Insufficient documentation

## 2019-04-29 DIAGNOSIS — Z833 Family history of diabetes mellitus: Secondary | ICD-10-CM | POA: Diagnosis not present

## 2019-04-29 DIAGNOSIS — O209 Hemorrhage in early pregnancy, unspecified: Secondary | ICD-10-CM | POA: Diagnosis not present

## 2019-04-29 DIAGNOSIS — N939 Abnormal uterine and vaginal bleeding, unspecified: Secondary | ICD-10-CM | POA: Diagnosis present

## 2019-04-29 DIAGNOSIS — F419 Anxiety disorder, unspecified: Secondary | ICD-10-CM | POA: Diagnosis not present

## 2019-04-29 LAB — CBC
HCT: 39.1 % (ref 36.0–46.0)
Hemoglobin: 12.6 g/dL (ref 12.0–15.0)
MCH: 27.8 pg (ref 26.0–34.0)
MCHC: 32.2 g/dL (ref 30.0–36.0)
MCV: 86.3 fL (ref 80.0–100.0)
Platelets: 263 10*3/uL (ref 150–400)
RBC: 4.53 MIL/uL (ref 3.87–5.11)
RDW: 12.5 % (ref 11.5–15.5)
WBC: 5.7 10*3/uL (ref 4.0–10.5)
nRBC: 0 % (ref 0.0–0.2)

## 2019-04-29 LAB — URINALYSIS, ROUTINE W REFLEX MICROSCOPIC
Bilirubin Urine: NEGATIVE
Glucose, UA: NEGATIVE mg/dL
Ketones, ur: NEGATIVE mg/dL
Nitrite: NEGATIVE
Protein, ur: NEGATIVE mg/dL
Specific Gravity, Urine: 1.025 (ref 1.005–1.030)
pH: 6 (ref 5.0–8.0)

## 2019-04-29 LAB — ABO/RH: ABO/RH(D): B POS

## 2019-04-29 LAB — WET PREP, GENITAL
Sperm: NONE SEEN
Trich, Wet Prep: NONE SEEN
Yeast Wet Prep HPF POC: NONE SEEN

## 2019-04-29 LAB — HCG, QUANTITATIVE, PREGNANCY: hCG, Beta Chain, Quant, S: 39178 m[IU]/mL — ABNORMAL HIGH (ref <5)

## 2019-04-29 LAB — POCT PREGNANCY, URINE: Preg Test, Ur: POSITIVE — AB

## 2019-04-29 MED ORDER — PREPLUS 27-1 MG PO TABS: 1.0000 | ORAL_TABLET | Freq: Every day | ORAL | 13 refills | Status: DC

## 2019-04-29 NOTE — MAU Note (Signed)
Started yesterday, started bleeding after an orgasm.   Not heavy, just sees bright red when she wipes.  Scant amt on pad this morning.  Had some abd pain, cramping

## 2019-04-29 NOTE — MAU Provider Note (Addendum)
History  CSN: [id number]  Arrival date and time: 04/29/19 8676   First Provider Initiated Contact with Patient 04/29/19 0848      Chief Complaint  Patient presents with  . Vaginal Bleeding  . Abdominal Pain   HPI Julia Huang is a 23 y.o. G2P0010 at [redacted]w[redacted]d from LMP who presents today with vaginal bleeding and cramping. She reports bleeding and cramping began yesterday after masturbation. States blood is bright red and occurs with wiping, scant blood present on a pad. This morning she noticed possible tissue on wiping. Of note patient is currently being treated with antibiotics for a UTI for the past 3 days, she is unsure which one.  Reports nausea, vaginal discharge, fatigue and back pain Denies fever, chills, flank pain, or dysuria  OB History    Gravida  2   Para      Term      Preterm      AB  1   Living        SAB  1   TAB      Ectopic      Multiple      Live Births              Past Medical History:  Diagnosis Date  . Anxiety   . Depression     History reviewed. No pertinent surgical history.  Family History  Problem Relation Age of Onset  . Diabetes Father   . Heart failure Father     Social History   Tobacco Use  . Smoking status: Never Smoker  . Smokeless tobacco: Never Used  Substance Use Topics  . Alcohol use: Yes  . Drug use: Yes    Types: Marijuana    Comment: last use 04/28/2019    Allergies: No Known Allergies  Medications Prior to Admission  Medication Sig Dispense Refill Last Dose  . acetaminophen (TYLENOL) 500 MG tablet Take 1 tablet (500 mg total) by mouth every 6 (six) hours as needed. (Patient taking differently: Take 1,000 mg by mouth every 6 (six) hours as needed (pain). ) 30 tablet 0 04/29/2019 at Unknown time  . escitalopram (LEXAPRO) 10 MG tablet Take 10 mg by mouth daily.  0 04/28/2019 at Unknown time  . omeprazole (PRILOSEC) 20 MG capsule Take 20 mg by mouth daily.  0 04/28/2019 at Unknown time    Review of  Systems  Constitutional: Positive for fatigue. Negative for chills and fever.  Gastrointestinal: Positive for abdominal pain and nausea. Negative for constipation, diarrhea and vomiting.  Genitourinary: Positive for vaginal bleeding and vaginal discharge. Negative for dysuria and flank pain.   Physical Exam   Blood pressure 125/69, pulse 70, temperature 98.2 F (36.8 C), temperature source Oral, resp. rate 17, height 5\' 8"  (1.727 m), weight 93 kg, last menstrual period 03/15/2019, SpO2 99 %.  Physical Exam  Constitutional: She is oriented to person, place, and time. She appears well-developed and well-nourished.  HENT:  Head: Normocephalic.  Eyes: Conjunctivae are normal.  Neck: Normal range of motion.  Cardiovascular: Normal rate and regular rhythm.  Respiratory: Effort normal and breath sounds normal.  GI: Soft. Bowel sounds are normal. There is no abdominal tenderness. There is no CVA tenderness.  Genitourinary:    Vagina normal.     Genitourinary Comments: Mild light brown discharge present in vaginal vault. No visible bleeding. On bimanual, no cervical motion tenderness, no adnexal masses or tenderness.   Neurological: She is alert and oriented to person, place, and time.  Skin: Skin is warm and dry.  Psychiatric: She has a normal mood and affect.   Results for orders placed or performed during the hospital encounter of 04/29/19 (from the past 24 hour(s))  Pregnancy, urine POC     Status: Abnormal   Collection Time: 04/29/19  8:17 AM  Result Value Ref Range   Preg Test, Ur POSITIVE (A) NEGATIVE  Urinalysis, Routine w reflex microscopic     Status: Abnormal   Collection Time: 04/29/19  8:31 AM  Result Value Ref Range   Color, Urine YELLOW YELLOW   APPearance CLOUDY (A) CLEAR   Specific Gravity, Urine 1.025 1.005 - 1.030   pH 6.0 5.0 - 8.0   Glucose, UA NEGATIVE NEGATIVE mg/dL   Hgb urine dipstick MODERATE (A) NEGATIVE   Bilirubin Urine NEGATIVE NEGATIVE   Ketones, ur  NEGATIVE NEGATIVE mg/dL   Protein, ur NEGATIVE NEGATIVE mg/dL   Nitrite NEGATIVE NEGATIVE   Leukocytes,Ua MODERATE (A) NEGATIVE   RBC / HPF 0-5 0 - 5 RBC/hpf   WBC, UA 0-5 0 - 5 WBC/hpf   Bacteria, UA RARE (A) NONE SEEN   Squamous Epithelial / LPF 21-50 0 - 5   Mucus PRESENT    Amorphous Crystal PRESENT   CBC     Status: None   Collection Time: 04/29/19  9:18 AM  Result Value Ref Range   WBC 5.7 4.0 - 10.5 K/uL   RBC 4.53 3.87 - 5.11 MIL/uL   Hemoglobin 12.6 12.0 - 15.0 g/dL   HCT 41.339.1 24.436.0 - 01.046.0 %   MCV 86.3 80.0 - 100.0 fL   MCH 27.8 26.0 - 34.0 pg   MCHC 32.2 30.0 - 36.0 g/dL   RDW 27.212.5 53.611.5 - 64.415.5 %   Platelets 263 150 - 400 K/uL   nRBC 0.0 0.0 - 0.2 %  hCG, quantitative, pregnancy     Status: Abnormal   Collection Time: 04/29/19  9:18 AM  Result Value Ref Range   hCG, Beta Chain, Quant, S 39,178 (H) <5 mIU/mL  ABO/Rh     Status: None   Collection Time: 04/29/19  9:18 AM  Result Value Ref Range   ABO/RH(D) B POS    No rh immune globuloin      NOT A RH IMMUNE GLOBULIN CANDIDATE, PT RH POSITIVE Performed at [facility], [address] [place], [place] [address]     [place] Ob Less Than 14 Weeks With Ob Transvaginal  Result Date: 04/29/2019 CLINICAL DATA:  Vaginal bleeding in 1st trimester pregnancy. EXAM: OBSTETRIC <14 WK US AND TRANSVAGINAL OB US TECHNIQUE: Both transabdominal and transvaginal ultrasound examinations were performed for complete evaluation of the gestation as well as the maternal uterus, adnexal regions, and pelvic cul-de-sac. Transvaginal technique was performed to assess early pregnancy. COMPARISON:  None. FINDINGS: Intrauterine gestational sac: Single Yolk sac:  Visualized. Embryo:  Visualized. Cardiac Activity: Visualized. Heart Rate: 115 bpm CRL:  6 mm   6 w   2 d                  US EDC: 12/21/2019 Subchorionic hemorrhage:  None visualized. Maternal uterus/adnexae: Both ovaries are normal in appearance. No mass or abnormal free fluid identified.  IMPRESSION: Single living IUP measuring 6 weeks 2 days, with US EDC of 12/21/2019. Electronically Signed   By: Danae OrleansJohn A Stahl M.D.   On: 04/29/2019 10:21    MAU Course  Procedures Orders Placed This Encounter  Procedures  . Culture, OB Urine  .  Wet prep, genital  . US OB LESS THAN 14 WEEKS WITH OB TRANSVAGINAL  . Urinalysis, Routine w reflex microscopic  . CBC  . hCG, quantitative, pregnancy  . Pregnancy, urine POC  . ABO/Rh  . Discharge patient   MDM - + UPT - CBC, U/S, beta hCG and ABO done to confirm IUP - UA with moderate Hgb and moderate lueks, sent for culture - GC/CT and wet prep performed, pending  Assessment and Plan   1. [redacted] weeks gestation of pregnancy   2. Vaginal bleeding in pregnancy, first trimester    Plan - IUP confirmed on U/S at 6 weeks and 2 days, hCG 39,178 - GC/CT, wet prep and urine culture pending - Discussed results with patient and informed bleeding likely from masturbation - Provided patient with first trimester prenatal care instructions as well as prenatal care provider list - All questions answered, patient discharged home in stable condition.   Alexa A Kimker 04/29/2019, 10:56 AM      I personally was present during the history, physical exam and medical decision-making activities of this service and have verified that the service and findings are accurately documented in the student's note.   Patient declines rx for prenatal vitamins. Given information on safe medication in pregnancy, [facility] providers in [place]. Advised to plan to initiate prenatal care around 10-[redacted] weeks GA.  Clayton Bibles Certified Nurse Midwife Faculty Practice 04/29/19 11:17 AM

## 2019-04-29 NOTE — Discharge Instructions (Signed)
First Trimester of Pregnancy ° °The first trimester of pregnancy is from week 1 until the end of week 13 (months 1 through 3). During this time, your baby will begin to develop inside you. At 6-8 weeks, the eyes and face are formed, and the heartbeat can be seen on ultrasound. At the end of 12 weeks, all the baby's organs are formed. Prenatal care is all the medical care you receive before the birth of your baby. Make sure you get good prenatal care and follow all of your doctor's instructions. °Follow these instructions at home: °Medicines °· Take over-the-counter and prescription medicines only as told by your doctor. Some medicines are safe and some medicines are not safe during pregnancy. °· Take a prenatal vitamin that contains at least 600 micrograms (mcg) of folic acid. °· If you have trouble pooping (constipation), take medicine that will make your stool soft (stool softener) if your doctor approves. °Eating and drinking ° °· Eat regular, healthy meals. °· Your doctor will tell you the amount of weight gain that is right for you. °· Avoid raw meat and uncooked cheese. °· If you feel sick to your stomach (nauseous) or throw up (vomit): °? Eat 4 or 5 small meals a day instead of 3 large meals. °? Try eating a few soda crackers. °? Drink liquids between meals instead of during meals. °· To prevent constipation: °? Eat foods that are high in fiber, like fresh fruits and vegetables, whole grains, and beans. °? Drink enough fluids to keep your pee (urine) clear or pale yellow. °Activity °· Exercise only as told by your doctor. Stop exercising if you have cramps or pain in your lower belly (abdomen) or low back. °· Do not exercise if it is too hot, too humid, or if you are in a place of great height (high altitude). °· Try to avoid standing for long periods of time. Move your legs often if you must stand in one place for a long time. °· Avoid heavy lifting. °· Wear low-heeled shoes. Sit and stand up  straight. °· You can have sex unless your doctor tells you not to. °Relieving pain and discomfort °· Wear a good support bra if your breasts are sore. °· Take warm water baths (sitz baths) to soothe pain or discomfort caused by hemorrhoids. Use hemorrhoid cream if your doctor says it is okay. °· Rest with your legs raised if you have leg cramps or low back pain. °· If you have puffy, bulging veins (varicose veins) in your legs: °? Wear support hose or compression stockings as told by your doctor. °? Raise (elevate) your feet for 15 minutes, 3-4 times a day. °? Limit salt in your food. °Prenatal care °· Schedule your prenatal visits by the twelfth week of pregnancy. °· Write down your questions. Take them to your prenatal visits. °· Keep all your prenatal visits as told by your doctor. This is important. °Safety °· Wear your seat belt at all times when driving. °· Make a list of emergency phone numbers. The list should include numbers for family, friends, the hospital, and police and fire departments. °General instructions °· Ask your doctor for a referral to a local prenatal class. Begin classes no later than at the start of month 6 of your pregnancy. °· Ask for help if you need counseling or if you need help with nutrition. Your doctor can give you advice or tell you where to go for help. °· Do not use hot tubs, steam   rooms, or saunas. °· Do not douche or use tampons or scented sanitary pads. °· Do not cross your legs for long periods of time. °· Avoid all herbs and alcohol. Avoid drugs that are not approved by your doctor. °· Do not use any tobacco products, including cigarettes, chewing tobacco, and electronic cigarettes. If you need help quitting, ask your doctor. You may get counseling or other support to help you quit. °· Avoid cat litter boxes and soil used by cats. These carry germs that can cause birth defects in the baby and can cause a loss of your baby (miscarriage) or stillbirth. °· Visit your dentist.  At home, brush your teeth with a soft toothbrush. Be gentle when you floss. °Contact a doctor if: °· You are dizzy. °· You have mild cramps or pressure in your lower belly. °· You have a nagging pain in your belly area. °· You continue to feel sick to your stomach, you throw up, or you have watery poop (diarrhea). °· You have a bad smelling fluid coming from your vagina. °· You have pain when you pee (urinate). °· You have increased puffiness (swelling) in your face, hands, legs, or ankles. °Get help right away if: °· You have a fever. °· You are leaking fluid from your vagina. °· You have spotting or bleeding from your vagina. °· You have very bad belly cramping or pain. °· You gain or lose weight rapidly. °· You throw up blood. It may look like coffee grounds. °· You are around people who have German measles, fifth disease, or chickenpox. °· You have a very bad headache. °· You have shortness of breath. °· You have any kind of trauma, such as from a fall or a car accident. °Summary °· The first trimester of pregnancy is from week 1 until the end of week 13 (months 1 through 3). °· To take care of yourself and your unborn baby, you will need to eat healthy meals, take medicines only if your doctor tells you to do so, and do activities that are safe for you and your baby. °· Keep all follow-up visits as told by your doctor. This is important as your doctor will have to ensure that your baby is healthy and growing well. °This information is not intended to replace advice given to you by your health care provider. Make sure you discuss any questions you have with your health care provider. °Document Released: 11/23/2007 Document Revised: 09/27/2018 Document Reviewed: 06/14/2016 °Elsevier Patient Education © 2020 Elsevier Inc[employer] ° °Safe Medications in Pregnancy  ° °Acne: °Benzoyl Peroxide °Salicylic Acid ° °Backache/Headache: °Tylenol: 2 regular strength every 4 hours OR °             2 Extra strength every 6  hours ° °Colds/Coughs/Allergies: °Benadryl (alcohol free) 25 mg every 6 hours as needed °Breath right strips °Claritin °Cepacol throat lozenges °Chloraseptic throat spray °Cold-Eeze- up to three times per day °Cough drops, alcohol free °Flonase (by prescription only) °Guaifenesin °Mucinex °Robitussin DM (plain only, alcohol free) °Saline nasal spray/drops °Sudafed (pseudoephedrine) & Actifed ** use only after [redacted] weeks gestation and if you do not have high blood pressure °Tylenol °Vicks Vaporub °Zinc lozenges °Zyrtec  ° °Constipation: °Colace °Ducolax suppositories °Fleet enema °Glycerin suppositories °Metamucil °Milk of magnesia °Miralax °Senokot °Smooth move tea ° °Diarrhea: °Kaopectate °Imodium A-D ° °*NO pepto Bismol ° °Hemorrhoids: °Anusol °Anusol HC °Preparation H °Tucks ° °Indigestion: °Tums °Maalox °Mylanta °Zantac  °Pepcid ° °Insomnia: °Benadryl (alcohol free) 25mg every 6   hours as needed °Tylenol PM °Unisom, no Gelcaps ° °Leg Cramps: °Tums °MagGel ° °Nausea/Vomiting:  °Bonine °Dramamine °Emetrol °Ginger extract °Sea bands °Meclizine  °Nausea medication to take during pregnancy:  °Unisom (doxylamine succinate 25 mg tablets) Take one tablet daily at bedtime. If symptoms are not adequately controlled, the dose can be increased to a maximum recommended dose of two tablets daily (1/2 tablet in the morning, 1/2 tablet mid-afternoon and one at bedtime). °Vitamin B6 100mg tablets. Take one tablet twice a day (up to 200 mg per day). ° °Skin Rashes: °Aveeno products °Benadryl cream or 25mg every 6 hours as needed °Calamine Lotion °1% cortisone cream ° °Yeast infection: °Gyne-lotrimin 7 °Monistat 7 ° ° °**If taking multiple medications, please check labels to avoid duplicating the same active ingredients °**take medication as directed on the label °** Do not exceed 4000 mg of tylenol in 24 hours °**Do not take medications that contain aspirin or ibuprofen ° ° ° °Prenatal Care Providers ° °         °[facility]  ° Phone: [phone] ° °[facility]  ° Phone: [phone] ° °[facility]      ° Phone: [phone]   °         °[facility] [place]    ° Phone: [phone] °         °[facility] [place]  ° Phone: [phone] ° °[facility] ° Phone: [phone] ° °[facility] °Phone: [phone] °    °[facility]  °Phone: [phone] ° °[facility]  °Phone: [phone] ° °[facility] °Phone: [phone] ° °[facility] °Phone: [phone] ° °[facility] °Phone: [phone] ° °[facility] °Phone: [phone] ° °[facility]  °Phone: [phone] ° ° °

## 2019-04-30 LAB — CULTURE, OB URINE: Culture: NO GROWTH

## 2019-04-30 LAB — GC/CHLAMYDIA PROBE AMP (~~LOC~~) NOT AT ARMC
Chlamydia: NEGATIVE
Comment: NEGATIVE
Comment: NORMAL
Neisseria Gonorrhea: NEGATIVE

## 2019-05-29 ENCOUNTER — Inpatient Hospital Stay (HOSPITAL_COMMUNITY): Payer: 59

## 2019-05-29 ENCOUNTER — Encounter (HOSPITAL_COMMUNITY): Payer: Self-pay

## 2019-05-29 ENCOUNTER — Other Ambulatory Visit: Payer: Self-pay

## 2019-05-29 ENCOUNTER — Inpatient Hospital Stay (HOSPITAL_COMMUNITY)
Admission: AD | Admit: 2019-05-29 | Discharge: 2019-05-29 | Disposition: A | Discharge status: Home / Self Care | Payer: 59 | Attending: Obstetrics and Gynecology | Admitting: Obstetrics and Gynecology

## 2019-05-29 DIAGNOSIS — R109 Unspecified abdominal pain: Secondary | ICD-10-CM | POA: Diagnosis not present

## 2019-05-29 DIAGNOSIS — O26891 Other specified pregnancy related conditions, first trimester: Secondary | ICD-10-CM | POA: Diagnosis not present

## 2019-05-29 DIAGNOSIS — R9389 Abnormal findings on diagnostic imaging of other specified body structures: Secondary | ICD-10-CM | POA: Insufficient documentation

## 2019-05-29 DIAGNOSIS — Z332 Encounter for elective termination of pregnancy: Secondary | ICD-10-CM

## 2019-05-29 DIAGNOSIS — O034 Incomplete spontaneous abortion without complication: Secondary | ICD-10-CM | POA: Diagnosis not present

## 2019-05-29 DIAGNOSIS — Z79899 Other long term (current) drug therapy: Secondary | ICD-10-CM | POA: Diagnosis not present

## 2019-05-29 DIAGNOSIS — O039 Complete or unspecified spontaneous abortion without complication: Secondary | ICD-10-CM | POA: Insufficient documentation

## 2019-05-29 DIAGNOSIS — Z3A10 10 weeks gestation of pregnancy: Secondary | ICD-10-CM | POA: Diagnosis not present

## 2019-05-29 DIAGNOSIS — F419 Anxiety disorder, unspecified: Secondary | ICD-10-CM | POA: Diagnosis not present

## 2019-05-29 DIAGNOSIS — F329 Major depressive disorder, single episode, unspecified: Secondary | ICD-10-CM | POA: Diagnosis not present

## 2019-05-29 DIAGNOSIS — N939 Abnormal uterine and vaginal bleeding, unspecified: Secondary | ICD-10-CM

## 2019-05-29 DIAGNOSIS — J45909 Unspecified asthma, uncomplicated: Secondary | ICD-10-CM | POA: Diagnosis not present

## 2019-05-29 DIAGNOSIS — O035 Genital tract and pelvic infection following complete or unspecified spontaneous abortion: Secondary | ICD-10-CM

## 2019-05-29 DIAGNOSIS — O209 Hemorrhage in early pregnancy, unspecified: Secondary | ICD-10-CM

## 2019-05-29 LAB — URINALYSIS, ROUTINE W REFLEX MICROSCOPIC
Bilirubin Urine: NEGATIVE
Glucose, UA: NEGATIVE mg/dL
Ketones, ur: NEGATIVE mg/dL
Nitrite: NEGATIVE
Protein, ur: 30 mg/dL — AB
RBC / HPF: >50 RBC/hpf — ABNORMAL HIGH (ref 0–5)
Specific Gravity, Urine: 1.023 (ref 1.005–1.030)
pH: 5 (ref 5.0–8.0)

## 2019-05-29 LAB — CBC
HCT: 29.6 % — ABNORMAL LOW (ref 36.0–46.0)
Hemoglobin: 9.3 g/dL — ABNORMAL LOW (ref 12.0–15.0)
MCH: 27.8 pg (ref 26.0–34.0)
MCHC: 31.4 g/dL (ref 30.0–36.0)
MCV: 88.6 fL (ref 80.0–100.0)
Platelets: 266 10*3/uL (ref 150–400)
RBC: 3.34 MIL/uL — ABNORMAL LOW (ref 3.87–5.11)
RDW: 12.7 % (ref 11.5–15.5)
WBC: 6.6 10*3/uL (ref 4.0–10.5)
nRBC: 0 % (ref 0.0–0.2)

## 2019-05-29 LAB — HCG, QUANTITATIVE, PREGNANCY: hCG, Beta Chain, Quant, S: 635 m[IU]/mL — ABNORMAL HIGH (ref <5)

## 2019-05-29 MED ORDER — OXYCODONE HCL 5 MG PO TABS: 5.0000 mg | ORAL_TABLET | Freq: Four times a day (QID) | ORAL | 0 refills | Status: AC | PRN

## 2019-05-29 MED ORDER — DOXYCYCLINE HYCLATE 100 MG PO TABS: 100.0000 mg | ORAL_TABLET | Freq: Two times a day (BID) | ORAL | 0 refills | Status: DC

## 2019-05-29 NOTE — Discharge Instructions (Signed)
Return to care   If you have heavier bleeding that soaks through more that 2 pads per hour for an hour or more  If you bleed so much that you feel like you might pass out or you do pass out  If you have significant abdominal pain that is not improved with Tylenol   If you develop a fever > 100.5       Endometritis  Endometritis is irritation, soreness, or inflammation that affects the lining of the uterus (endometrium). Infection is usually the cause of endometritis. It is important to get treatment to prevent complications. Common complications may include more severe infections and not being able to have children(infertility). What are the causes? This condition may be caused by:  Bacterial infections.  STIs (sexually transmitted infections).  A miscarriage or childbirth, especially after a long labor or cesarean delivery.  Certain gynecological procedures. These may include dilation and curettage (D&C), hysteroscopy, or birth control (contraceptive) insertion.  Tuberculosis (TB). What are the signs or symptoms? Symptoms of this condition include:  Fever.  Lower abdomen (abdominal) pain.  Pelvis (pelvic) pain.  Abnormal vaginal discharge or bleeding.  Abdominal bloating (distention) or swelling.  General discomfort or generally feeling ill.  Discomfort with bowel movements.  Constipation. How is this diagnosed? This condition may be diagnosed based on:  A physical exam, including a pelvic exam.  Tests, such as: ? Blood tests. ? Removal of a sample of endometrial tissue for testing (endometrial biopsy). ? Examining a sample of vaginal discharge under a microscope (wet prep). ? Removal of a sample of fluid from the cervix for testing (cervical culture). ? Surgical examination of the pelvis and abdomen. How is this treated? This condition is treated with:  Antibiotic medicines.  For more severe cases, hospitalization may be needed to give fluids and  antibiotics directly into a vein through an IV tube. Follow these instructions at home:  Take over-the-counter and prescription medicines only as told by your health care provider.  Drink enough fluid to keep your urine clear or pale yellow.  Take your antibiotic medicine as told by your health care provider. Do not stop taking the antibiotic even if you start to feel better.  Do not douche or have sex (including vaginal, oral, and anal sex) until your health care provider approves.  If your endometritis was caused by an STI, do not have sex (including vaginal, oral, and anal sex) until your partner has also been treated for the STI.  Return to your normal activities as told by your health care provider. Ask your health care provider what activities are safe for you.  Keep all follow-up visits as told by your health care provider. This is important. Contact a health care provider if:  You have pain that does not get better with medicine.  You have a fever.  You have pain with bowel movements. Get help right away if:  You have abdominal swelling.  You have abdominal pain that gets worse.  You have bad-smelling vaginal discharge, or an increased amount of vaginal discharge.  You have abnormal vaginal bleeding.  You have nausea and vomiting. Summary  Endometritis affects the lining of the uterus (endometrium) and is usually caused by an infection.  It is important to get treatment to prevent complications.  You have several treatment options for endometritis. Treatment may include antibiotics and IV fluids.  Take your antibiotic medicine as told by your health care provider. Do not stop taking the antibiotic even if  you start to feel better.  Do not douche or have sex (including vaginal, oral, and anal sex) until your health care provider approves. This information is not intended to replace advice given to you by your health care provider. Make sure you discuss any  questions you have with your health care provider. Document Released: 05/31/2001 Document Revised: 05/19/2017 Document Reviewed: 06/21/2016 Elsevier Patient Education  2020 [employer].

## 2019-05-29 NOTE — MAU Note (Signed)
Julia Huang is a 23 y.o. here in MAU reporting: 3 weeks ago she had a TAB, took cytotec. States everything has been okay and seemed to go the way it was supposed to. The past 3 days the bleeding has gotten much heavier with clots and is in severe pain unless she takes tylenol. Yesterday was soaking through maxi pads in less then an hour and today is mostly just seeing blood clots. The blood clots range from quarter size to golf ball size. States she did not have a follow up u/s  Onset of complaint: 3 days  Pain score: 5/10  Vitals:   05/29/19 1613  BP: 121/74  Pulse: 81  Resp: 16  Temp: 98.2 F (36.8 C)  SpO2: 99%      Lab orders placed from triage: none

## 2019-05-29 NOTE — MAU Provider Note (Signed)
Chief Complaint: Abdominal Pain and Vaginal Bleeding   First Provider Initiated Contact with Patient 05/29/19 1646     SUBJECTIVE HPI: Julia Huang is a 23 y.o. G2P0010 at [redacted]w[redacted]d who presents to Maternity Admissions reporting vaginal bleeding. Took 2 doses of cytotec 3 weeks ago for an abortion. States initially she bled like she was supposed to followed by a few weeks of spotting. For the last 3 days she's had heavier bleeding with blood clots. Passing blood clots whenever she goes to the bathroom that range in size from marbles to golf balls. Has increase in abdominal cramping when the clots come out. Denies fever/chills. Has had intercourse since her abortion. Did not have any kind of follow up with the clinic she used.   Location: abdomen Quality: cramping Severity: 6/10 on pain scale Duration: 3 days Timing: intermittent Modifying factors: worse with bleeding Associated signs and symptoms: vaginal bleeding  Past Medical History:  Diagnosis Date  . Anxiety   . Asthma   . Depression    OB History  Gravida Para Term Preterm AB Living  2 0 0 0 1    SAB TAB Ectopic Multiple Live Births  1 0 0        # Outcome Date GA Lbr Len/2nd Weight Sex Delivery Anes PTL Lv  2 Current           1 SAB            History reviewed. No pertinent surgical history. Social History   Socioeconomic History  . Marital status: Married    Spouse name: Not on file  . Number of children: Not on file  . Years of education: Not on file  . Highest education level: Not on file  Occupational History  . Not on file  Social Needs  . Financial resource strain: Not on file  . Food insecurity    Worry: Not on file    Inability: Not on file  . Transportation needs    Medical: Not on file    Non-medical: Not on file  Tobacco Use  . Smoking status: Never Smoker  . Smokeless tobacco: Never Used  Substance and Sexual Activity  . Alcohol use: Yes    Alcohol/week: 1.0 standard drinks    Types: 1  Glasses of wine per week  . Drug use: Yes    Types: Marijuana    Comment: 05-29-19  . Sexual activity: Yes  Lifestyle  . Physical activity    Days per week: Not on file    Minutes per session: Not on file  . Stress: Not on file  Relationships  . Social Musician on phone: Not on file    Gets together: Not on file    Attends religious service: Not on file    Active member of club or organization: Not on file    Attends meetings of clubs or organizations: Not on file    Relationship status: Not on file  . Intimate partner violence    Fear of current or ex partner: Not on file    Emotionally abused: Not on file    Physically abused: Not on file    Forced sexual activity: Not on file  Other Topics Concern  . Not on file  Social History Narrative   ** Merged History Encounter **       Family History  Problem Relation Age of Onset  . Diabetes Father   . Heart failure Father   . Mental  illness Mother   . Hyperlipidemia Father   . Mental illness Father    No current facility-administered medications on file prior to encounter.    Current Outpatient Medications on File Prior to Encounter  Medication Sig Dispense Refill  . escitalopram (LEXAPRO) 10 MG tablet Take 10 mg by mouth daily.  0  . metroNIDAZOLE (FLAGYL) 500 MG tablet Take 1 tablet (500 mg total) by mouth 2 (two) times daily. 14 tablet 0  . Prenatal Vit-Fe Fumarate-FA (PREPLUS) 27-1 MG TABS Take 1 tablet by mouth daily. 30 tablet 13   No Known Allergies  I have reviewed patient's Past Medical Hx, Surgical Hx, Family Hx, Social Hx, medications and allergies.   Review of Systems  Constitutional: Negative.   Gastrointestinal: Positive for abdominal pain. Negative for constipation, diarrhea, nausea and vomiting.  Genitourinary: Positive for vaginal bleeding.    OBJECTIVE Patient Vitals for the past 24 hrs:  BP Temp Temp src Pulse Resp SpO2 Height Weight  05/29/19 1906 116/69 - - 82 - - - -  05/29/19  1613 121/74 98.2 F (36.8 C) Oral 81 16 99 % - -  05/29/19 1608 - - - - - - 5\' 8"  (1.727 m) 94.4 kg   Constitutional: Well-developed, well-nourished female in no acute distress.  Cardiovascular: normal rate & rhythm, no murmur Respiratory: normal rate and effort. Lung sounds clear throughout GI: tender over suprapubic area. Abd soft, Pos BS x 4. No guarding or rebound tenderness MS: Extremities nontender, no edema, normal ROM Neurologic: Alert and oriented x 4.  GU:     SPECULUM EXAM: NEFG, small amount of dark red blood     LAB RESULTS Results for orders placed or performed during the hospital encounter of 05/29/19 (from the past 24 hour(s))  CBC     Status: Abnormal   Collection Time: 05/29/19  4:55 PM  Result Value Ref Range   WBC 6.6 4.0 - 10.5 K/uL   RBC 3.34 (L) 3.87 - 5.11 MIL/uL   Hemoglobin 9.3 (L) 12.0 - 15.0 g/dL   HCT 29.6 (L) 36.0 - 46.0 %   MCV 88.6 80.0 - 100.0 fL   MCH 27.8 26.0 - 34.0 pg   MCHC 31.4 30.0 - 36.0 g/dL   RDW 12.7 11.5 - 15.5 %   Platelets 266 150 - 400 K/uL   nRBC 0.0 0.0 - 0.2 %  hCG, quantitative, pregnancy     Status: Abnormal   Collection Time: 05/29/19  4:55 PM  Result Value Ref Range   hCG, Beta Chain, Quant, S 635 (H) <5 mIU/mL    IMAGING US Pelvis Transvaginal Non-ob (tv Only)  Result Date: 05/29/2019 CLINICAL DATA:  Recent chemical abortion 3 weeks ago with heavy vaginal bleeding EXAM: ULTRASOUND PELVIS TRANSVAGINAL TECHNIQUE: Transvaginal ultrasound examination of the pelvis was performed including evaluation of the uterus, ovaries, adnexal regions, and pelvic cul-de-sac. COMPARISON:  04/29/2019 FINDINGS: Uterus Measurements: 9.3 x 4.1 x 4.8 cm. = volume: 95.4 mL. Previously seen gestational sac is no longer identified. Endometrium Thickness: 17.6 mm. Increased vascularity is noted within the endometrium. Some hypoechoic areas are noted within the endometrium as well. Right ovary Measurements: 3.0 x 2.7 x 2.2 cm = volume: 9.0 mL. Normal  appearance/no adnexal mass. Left ovary Measurements: 2.9 x 1.9 x 2.5 cm = volume: 7.4 mL. Normal appearance/no adnexal mass. Other findings:  No abnormal free fluid IMPRESSION: Thickened endometrium with central hypoechoic areas and diffuse increased vascularity. These changes are highly suspicious for retained products of conception.  These results will be called to the ordering clinician or representative by the Radiologist Assistant, and communication documented in the PACS or zVision Dashboard. Electronically Signed   By: Alcide CleverMark  Lukens M.D.   On: 05/29/2019 18:12    MAU COURSE Orders Placed This Encounter  Procedures  . US PELVIS TRANSVAGINAL NON-OB (TV ONLY)  . Urinalysis, Routine w reflex microscopic  . CBC  . hCG, quantitative, pregnancy  . Discharge patient   Meds ordered this encounter  Medications  . doxycycline (VIBRA-TABS) 100 MG tablet    Sig: Take 1 tablet (100 mg total) by mouth 2 (two) times daily for 10 days.    Dispense:  20 tablet    Refill:  0    Order Specific Question:   Supervising Provider    Answer:   ERVIN, MICHAEL L [[id number]]  . oxyCODONE (ROXICODONE) 5 MG immediate release tablet    Sig: Take 1 tablet (5 mg total) by mouth every 6 (six) hours as needed for up to 5 days for severe pain.    Dispense:  20 tablet    Refill:  0    Order Specific Question:   Supervising Provider    Answer:   CONSTANT, PEGGY [[id number]]    MDM Pt afebrile & vital signs stable. On exam, small to moderate amount of bleeding & pt tender over uterus.  CBC & HCG collected. HGB has dropped 12.6>9.3 since being seen in MAU last month. No leukocytosis.  Ultrasound consistent with retained POCs. Endometrial thickness 17.6 mm  Reviewed with Dr. Alysia PennaErvin. Will treat endometritis with doxycycline & schedule for outpatient D&C.   ASSESSMENT 1. Retained products of conception following abortion   2. Vaginal bleeding in pregnancy, first trimester   3. Abortion in first trimester   4. Vagina bleeding    5. Endometritis following abortive pregnancy     PLAN Discharge home in stable condition. Bleeding & infection precautions Pelvic rest Rx doxycycline & oxycodone  [facility]  Follow-up Information    [facility] Follow up.   Specialty: Obstetrics and Gynecology Why: return for worsening symptoms Contact information: [address] [id number] [place] [id number] [phone]         Allergies as of 05/29/2019   No Known Allergies     Medication List    STOP taking these medications   metroNIDAZOLE 500 MG tablet Commonly known as: FLAGYL   PrePLUS 27-1 MG Tabs     TAKE these medications   doxycycline 100 MG tablet Commonly known as: VIBRA-TABS Take 1 tablet (100 mg total) by mouth 2 (two) times daily for 10 days.   escitalopram 10 MG tablet Commonly known as: LEXAPRO Take 10 mg by mouth daily.   oxyCODONE 5 MG immediate release tablet Commonly known as: Roxicodone Take 1 tablet (5 mg total) by mouth every 6 (six) hours as needed for up to 5 days for severe pain.        Judeth HornLawrence, Maebell Lyvers, NP 05/29/2019  7:15 PM

## 2019-05-30 ENCOUNTER — Encounter: Payer: Self-pay | Admitting: *Deleted

## 2019-05-30 ENCOUNTER — Encounter (HOSPITAL_BASED_OUTPATIENT_CLINIC_OR_DEPARTMENT_OTHER): Payer: Self-pay | Admitting: Obstetrics and Gynecology

## 2019-05-31 ENCOUNTER — Other Ambulatory Visit: Payer: Self-pay | Admitting: Obstetrics and Gynecology

## 2019-05-31 MED ORDER — DOXYCYCLINE HYCLATE 100 MG IV SOLR: 100.0000 mg | Freq: Once | INTRAVENOUS | Status: DC

## 2019-06-01 ENCOUNTER — Other Ambulatory Visit (HOSPITAL_COMMUNITY): Payer: 59 | Attending: Obstetrics and Gynecology

## 2019-06-03 ENCOUNTER — Other Ambulatory Visit (HOSPITAL_COMMUNITY)
Admission: RE | Admit: 2019-06-03 | Discharge: 2019-06-03 | Disposition: A | Discharge status: Home / Self Care | Payer: 59 | Source: Ambulatory Visit | Attending: Obstetrics and Gynecology | Admitting: Obstetrics and Gynecology

## 2019-06-03 DIAGNOSIS — Z01812 Encounter for preprocedural laboratory examination: Secondary | ICD-10-CM | POA: Insufficient documentation

## 2019-06-03 DIAGNOSIS — Z20828 Contact with and (suspected) exposure to other viral communicable diseases: Secondary | ICD-10-CM | POA: Diagnosis not present

## 2019-06-03 LAB — SARS CORONAVIRUS 2 (TAT 6-24 HRS): SARS Coronavirus 2: NEGATIVE

## 2019-06-03 NOTE — Progress Notes (Signed)
Left message for pt to come pick up Ensure pre surgery drink.

## 2019-06-05 ENCOUNTER — Ambulatory Visit (HOSPITAL_BASED_OUTPATIENT_CLINIC_OR_DEPARTMENT_OTHER): Payer: 59 | Admitting: Anesthesiology

## 2019-06-05 ENCOUNTER — Encounter (HOSPITAL_BASED_OUTPATIENT_CLINIC_OR_DEPARTMENT_OTHER)
Admission: RE | Disposition: A | Discharge status: Home / Self Care | Payer: Self-pay | Source: Home / Self Care | Attending: Obstetrics and Gynecology

## 2019-06-05 ENCOUNTER — Encounter: Payer: Self-pay | Admitting: Obstetrics and Gynecology

## 2019-06-05 ENCOUNTER — Encounter (HOSPITAL_BASED_OUTPATIENT_CLINIC_OR_DEPARTMENT_OTHER): Payer: Self-pay | Admitting: Obstetrics and Gynecology

## 2019-06-05 ENCOUNTER — Other Ambulatory Visit: Payer: Self-pay | Admitting: Obstetrics and Gynecology

## 2019-06-05 ENCOUNTER — Ambulatory Visit (HOSPITAL_BASED_OUTPATIENT_CLINIC_OR_DEPARTMENT_OTHER)
Admission: RE | Admit: 2019-06-05 | Discharge: 2019-06-05 | Disposition: A | Discharge status: Home / Self Care | Payer: 59 | Attending: Obstetrics and Gynecology | Admitting: Obstetrics and Gynecology

## 2019-06-05 ENCOUNTER — Other Ambulatory Visit: Payer: Self-pay

## 2019-06-05 DIAGNOSIS — Z6830 Body mass index (BMI) 30.0-30.9, adult: Secondary | ICD-10-CM | POA: Insufficient documentation

## 2019-06-05 DIAGNOSIS — F1290 Cannabis use, unspecified, uncomplicated: Secondary | ICD-10-CM | POA: Insufficient documentation

## 2019-06-05 DIAGNOSIS — O074 Failed attempted termination of pregnancy without complication: Secondary | ICD-10-CM | POA: Insufficient documentation

## 2019-06-05 DIAGNOSIS — F329 Major depressive disorder, single episode, unspecified: Secondary | ICD-10-CM | POA: Insufficient documentation

## 2019-06-05 DIAGNOSIS — E669 Obesity, unspecified: Secondary | ICD-10-CM | POA: Diagnosis not present

## 2019-06-05 DIAGNOSIS — J45909 Unspecified asthma, uncomplicated: Secondary | ICD-10-CM | POA: Diagnosis not present

## 2019-06-05 DIAGNOSIS — Z9889 Other specified postprocedural states: Secondary | ICD-10-CM

## 2019-06-05 DIAGNOSIS — F419 Anxiety disorder, unspecified: Secondary | ICD-10-CM | POA: Insufficient documentation

## 2019-06-05 DIAGNOSIS — O031 Delayed or excessive hemorrhage following incomplete spontaneous abortion: Secondary | ICD-10-CM

## 2019-06-05 HISTORY — PX: DILATION AND EVACUATION: SHX1459

## 2019-06-05 SURGERY — DILATION AND EVACUATION, UTERUS
Anesthesia: Monitor Anesthesia Care | Site: Vagina

## 2019-06-05 MED ORDER — PROPOFOL 10 MG/ML IV BOLUS
INTRAVENOUS | Status: AC
  Filled 2019-06-05: qty 20

## 2019-06-05 MED ORDER — PROPOFOL 500 MG/50ML IV EMUL
INTRAVENOUS | Status: DC | PRN
  Administered 2019-06-05: 75 ug/kg/min via INTRAVENOUS

## 2019-06-05 MED ORDER — SODIUM CHLORIDE 0.9 % IV SOLN
INTRAVENOUS | Status: DC | PRN
  Administered 2019-06-05: 16:00:00 100 mg via INTRAVENOUS

## 2019-06-05 MED ORDER — LIDOCAINE HCL (PF) 1 % IJ SOLN
INTRAMUSCULAR | Status: AC
  Filled 2019-06-05: qty 30

## 2019-06-05 MED ORDER — ONDANSETRON HCL 4 MG/2ML IJ SOLN
INTRAMUSCULAR | Status: DC | PRN
  Administered 2019-06-05: 4 mg via INTRAVENOUS

## 2019-06-05 MED ORDER — ONDANSETRON HCL 4 MG/2ML IJ SOLN: 4.0000 mg | Freq: Once | INTRAMUSCULAR | Status: DC | PRN

## 2019-06-05 MED ORDER — FERROUS GLUCONATE 324 (38 FE) MG PO TABS: 324.0000 mg | ORAL_TABLET | Freq: Every day | ORAL | 0 refills | Status: AC

## 2019-06-05 MED ORDER — FENTANYL CITRATE (PF) 100 MCG/2ML IJ SOLN
INTRAMUSCULAR | Status: AC
  Filled 2019-06-05: qty 2

## 2019-06-05 MED ORDER — OXYCODONE HCL 5 MG/5ML PO SOLN: 5.0000 mg | Freq: Once | ORAL | Status: DC | PRN

## 2019-06-05 MED ORDER — MIDAZOLAM HCL 5 MG/5ML IJ SOLN
INTRAMUSCULAR | Status: DC | PRN
  Administered 2019-06-05: 2 mg via INTRAVENOUS

## 2019-06-05 MED ORDER — OXYCODONE-ACETAMINOPHEN 5-325 MG PO TABS: 1.0000 | ORAL_TABLET | Freq: Four times a day (QID) | ORAL | 0 refills | Status: DC | PRN

## 2019-06-05 MED ORDER — PROPOFOL 10 MG/ML IV BOLUS
INTRAVENOUS | Status: DC | PRN
  Administered 2019-06-05: 30 mg via INTRAVENOUS
  Administered 2019-06-05: 40 mg via INTRAVENOUS

## 2019-06-05 MED ORDER — LACTATED RINGERS IV SOLN: INTRAVENOUS | Status: DC

## 2019-06-05 MED ORDER — DOCUSATE SODIUM 100 MG PO CAPS: 100.0000 mg | ORAL_CAPSULE | Freq: Two times a day (BID) | ORAL | 0 refills | Status: AC

## 2019-06-05 MED ORDER — KETOROLAC TROMETHAMINE 30 MG/ML IJ SOLN
INTRAMUSCULAR | Status: AC
  Filled 2019-06-05: qty 1

## 2019-06-05 MED ORDER — OXYCODONE HCL 5 MG PO TABS: 5.0000 mg | ORAL_TABLET | Freq: Once | ORAL | Status: DC | PRN

## 2019-06-05 MED ORDER — SILVER NITRATE-POT NITRATE 75-25 % EX MISC
CUTANEOUS | Status: AC
  Filled 2019-06-05: qty 10

## 2019-06-05 MED ORDER — OXYCODONE-ACETAMINOPHEN 5-325 MG PO TABS: 1.0000 | ORAL_TABLET | Freq: Four times a day (QID) | ORAL | 0 refills | Status: AC | PRN

## 2019-06-05 MED ORDER — FENTANYL CITRATE (PF) 100 MCG/2ML IJ SOLN
INTRAMUSCULAR | Status: DC | PRN
  Administered 2019-06-05 (×2): 50 ug via INTRAVENOUS

## 2019-06-05 MED ORDER — LIDOCAINE HCL 1 % IJ SOLN
INTRAMUSCULAR | Status: DC | PRN
  Administered 2019-06-05: 20 mL

## 2019-06-05 MED ORDER — KETOROLAC TROMETHAMINE 30 MG/ML IJ SOLN
INTRAMUSCULAR | Status: AC
  Filled 2019-06-05: qty 2

## 2019-06-05 MED ORDER — ACETAMINOPHEN 500 MG PO TABS: 500.0000 mg | ORAL_TABLET | Freq: Four times a day (QID) | ORAL | 0 refills | Status: AC | PRN

## 2019-06-05 MED ORDER — LIDOCAINE 2% (20 MG/ML) 5 ML SYRINGE
INTRAMUSCULAR | Status: DC | PRN
  Administered 2019-06-05: 60 mg via INTRAVENOUS

## 2019-06-05 MED ORDER — ONDANSETRON HCL 4 MG/2ML IJ SOLN
INTRAMUSCULAR | Status: AC
  Filled 2019-06-05: qty 2

## 2019-06-05 MED ORDER — MIDAZOLAM HCL 2 MG/2ML IJ SOLN
INTRAMUSCULAR | Status: AC
  Filled 2019-06-05: qty 2

## 2019-06-05 MED ORDER — IBUPROFEN 600 MG PO TABS: 600.0000 mg | ORAL_TABLET | Freq: Four times a day (QID) | ORAL | 0 refills | Status: AC | PRN

## 2019-06-05 MED ORDER — FENTANYL CITRATE (PF) 100 MCG/2ML IJ SOLN
25.0000 ug | INTRAMUSCULAR | Status: DC | PRN
  Administered 2019-06-05 (×2): 50 ug via INTRAVENOUS

## 2019-06-05 SURGICAL SUPPLY — 26 items
CATH ROBINSON RED A/P 14FR (CATHETERS) IMPLANT
COVER PROBE 5X48 (MISCELLANEOUS)
GAUZE 4X4 16PLY RFD (DISPOSABLE) ×2 IMPLANT
GLOVE BIOGEL PI IND STRL 7.0 (GLOVE) ×1 IMPLANT
GLOVE BIOGEL PI IND STRL 7.5 (GLOVE) ×1 IMPLANT
GLOVE BIOGEL PI INDICATOR 7.0 (GLOVE) ×1
GLOVE BIOGEL PI INDICATOR 7.5 (GLOVE) ×1
GLOVE SURG SS PI 7.0 STRL IVOR (GLOVE) ×2 IMPLANT
GOWN STRL REUS W/ TWL LRG LVL3 (GOWN DISPOSABLE) ×1 IMPLANT
GOWN STRL REUS W/TWL LRG LVL3 (GOWN DISPOSABLE) ×1
GOWN STRL REUS W/TWL XL LVL3 (GOWN DISPOSABLE) ×2 IMPLANT
HOSE CONNECTING 18IN BERKELEY (TUBING) IMPLANT
KIT BERKELEY 1ST TRIMESTER 3/8 (MISCELLANEOUS) ×2 IMPLANT
KIT CVR 48X5XPRB PLUP LF (MISCELLANEOUS) IMPLANT
NS IRRIG 1000ML POUR BTL (IV SOLUTION) ×2 IMPLANT
PACK VAGINAL MINOR WOMEN LF (CUSTOM PROCEDURE TRAY) ×2 IMPLANT
PAD OB MATERNITY 4.3X12.25 (PERSONAL CARE ITEMS) ×2 IMPLANT
PAD PREP 24X48 CUFFED NSTRL (MISCELLANEOUS) ×2 IMPLANT
SET BERKELEY SUCTION TUBING (SUCTIONS) ×2 IMPLANT
SLEEVE SCD COMPRESS KNEE MED (MISCELLANEOUS) ×2 IMPLANT
TOWEL GREEN STERILE FF (TOWEL DISPOSABLE) ×2 IMPLANT
TRAP TISSUE FILTER (MISCELLANEOUS) IMPLANT
VACURETTE 10 RIGID CVD (CANNULA) IMPLANT
VACURETTE 7MM CVD STRL WRAP (CANNULA) IMPLANT
VACURETTE 8 RIGID CVD (CANNULA) IMPLANT
VACURETTE 9 RIGID CVD (CANNULA) IMPLANT

## 2019-06-05 NOTE — Transfer of Care (Signed)
Immediate Anesthesia Transfer of Care Note  Patient: Julia Huang  Procedure(s) Performed: DILATATION AND EVACUATION (N/A Vagina )  Patient Location: PACU  Anesthesia Type:MAC  Level of Consciousness: awake, alert  and oriented  Airway & Oxygen Therapy: Patient Spontanous Breathing and Patient connected to face mask oxygen  Post-op Assessment: Report given to RN and Post -op Vital signs reviewed and stable  Post vital signs: Reviewed and stable  Last Vitals:  Vitals Value Taken Time  BP    Temp    Pulse 98 06/05/19 1609  Resp 21 06/05/19 1609  SpO2 100 % 06/05/19 1609  Vitals shown include unvalidated device data.  Last Pain:  Vitals:   06/05/19 1404  TempSrc: Tympanic  PainSc: 9       Patients Stated Pain Goal: 6 (58/59/29 2446)  Complications: No apparent anesthesia complications

## 2019-06-05 NOTE — Op Note (Signed)
Operative Note   06/05/2019  PRE-OP DIAGNOSIS: Retained products of conception   POST-OP DIAGNOSIS: Same  SURGEON: Surgeon(s) and Role:    Aletha Halim, MD - Primary  ASSISTANT: None  PROCEDURE:  Suction dilation and curettage  ANESTHESIA: Monitor Anesthesia Care and paracervical block  ESTIMATED BLOOD LOSS: 5mL  DRAINS: no I/O cath done  TOTAL IV FLUIDS: per anesthesia note  SPECIMENS: products of conception to pathology  VTE PROPHYLAXIS: SCDs to the bilateral lower extremities  ANTIBIOTICS: Doxycycline 100mg  IV x 1  COMPLICATIONS: none  DISPOSITION: PACU - hemodynamically stable.  CONDITION: stable  BLOOD TYPE: B POS. Rhogam given:no  FINDINGS: Exam under anesthesia revealed 6-8 week sized uterus with no masses and bilateral adnexa without masses or fullness. Necrotic appearing products of conception were seen, with gritty texture in all four quadrants.   PROCEDURE IN DETAIL:  After informed consent was obtained, the patient was taken to the operating room where anesthesia was obtained without difficulty. The patient was positioned in the dorsal lithotomy position in [place]. The patient was examined under anesthesia, with the above noted findings.  The bi-valved speculum was placed inside the patient's vagina, and the the anterior lip of the cervix was seen and grasped with the tenaculum.  A paracervical block was achieved with 42mL of 1% lidocaine and then the cervix already passed a #8 cannula.  The suction was then calibrated to 102mmHg and connected to the number 8 cannula, which was then introduced with the above noted findings. A gentle curettage was done at the end and yield no products of conception.   The suction was then done one more time to remove any remaining curettage material.   Excellent hemostasis was noted, and all instruments were removed, with excellent hemostasis noted throughout.  She was then taken out of dorsal lithotomy. The patient  tolerated the procedure well.  Sponge, lap and instrument counts were correct x2.  The patient was taken to recovery room in excellent condition.  Durene Romans MD Attending Center for [employer] farm manager)

## 2019-06-05 NOTE — Discharge Instructions (Addendum)
 We will discuss your surgery once again in detail at your post-op visit in two to four weeks. If you haven't already done so, please call to make your appointment as soon as possible.  Dilation and Curettage or Vacuum Curettage, Care After These instructions give you information on caring for yourself after your procedure. Your doctor may also give you more specific instructions. Call your doctor if you have any problems or questions after your procedure. HOME CARE  Do not drive for 24 hours.  Wait 1 week before doing any activities that wear you out.  Do not stand for a long time.  Limit stair climbing to once or twice a day.  Rest often.  Continue with your usual diet.  Drink enough fluids to keep your pee (urine) clear or pale yellow.  If you have a hard time pooping (constipation), you may:  Take a medicine to help you go poop (laxative) as told by your doctor.  Eat more fruit and bran.  Drink more fluids.  Take showers, not baths, for as long as told by your doctor.  Do not swim or use a hot tub until your doctor says it is okay.  Have someone with you for 1day after the procedure.  Do not douche, use tampons, or have sex (intercourse) until seen by your doctor  Only take medicines as told by your doctor. Do not take aspirin. It can cause bleeding.  Keep all doctor visits. GET HELP IF:  You have cramps or pain not helped by medicine.  You have new pain in the belly (abdomen).  You have a bad smelling fluid coming from your vagina.  You have a rash.  You have problems with any medicine. GET HELP RIGHT AWAY IF:   You start to bleed more than a regular period.  You have a fever.  You have chest pain.  You have trouble breathing.  You feel dizzy or feel like passing out (fainting).  You pass out.  You have pain in the tops of your shoulders.  You have vaginal bleeding with or without clumps of blood (blood clots). MAKE SURE YOU:  Understand  these instructions.  Will watch your condition.  Will get help right away if you are not doing well or get worse. Document Released: 03/15/2008 Document Revised: 06/11/2013 Document Reviewed: 01/03/2013 ExitCare Patient Information 2015 [employer]. This information is not intended to replace advice given to you by your health care provider. Make sure you discuss any questions you have with your health care provider.    Post Anesthesia Home Care Instructions  Activity: Get plenty of rest for the remainder of the day. A responsible individual must stay with you for 24 hours following the procedure.  For the next 24 hours, DO NOT: -Drive a car -Operate machinery -Drink alcoholic beverages -Take any medication unless instructed by your physician -Make any legal decisions or sign important papers.  Meals: Start with liquid foods such as gelatin or soup. Progress to regular foods as tolerated. Avoid greasy, spicy, heavy foods. If nausea and/or vomiting occur, drink only clear liquids until the nausea and/or vomiting subsides. Call your physician if vomiting continues.  Special Instructions/Symptoms: Your throat may feel dry or sore from the anesthesia or the breathing tube placed in your throat during surgery. If this causes discomfort, gargle with warm salt water. The discomfort should disappear within 24 hours.  If you had a scopolamine patch placed behind your ear for the management of post- operative   nausea and/or vomiting:  1. The medication in the patch is effective for 72 hours, after which it should be removed.  Wrap patch in a tissue and discard in the trash. Wash hands thoroughly with soap and water. 2. You may remove the patch earlier than 72 hours if you experience unpleasant side effects which may include dry mouth, dizziness or visual disturbances. 3. Avoid touching the patch. Wash your hands with soap and water after contact with the patch.      

## 2019-06-05 NOTE — Progress Notes (Signed)
GYN Note TAUS done and looks like there may be rPOCs in the uterus. TVUS done with chaperone and I don't see a thin endometrial stripe and there appears to be some calcified remains with ?blood flow. I told her I still recommend d&c which she is amenable to  Durene Romans MD Attending Center for [place] ([facility]ulty [facility]) 06/05/2019 Time: 0865

## 2019-06-05 NOTE — Progress Notes (Unsigned)
GYN Note  Front desk stated that patient wanted to see me before checking in. Patient states that since we saw her in the hospital on 12/9 and dx her with retained products of conception after an elective abortion in mid November, she states she's had heavy bleeding passing clots. Nothing today and bleeding is minimal, scant and no pain, fevers, chills. She states she took abortion pills from a [facility] in mid November and she has not seen them for any follow up or ultrasounds. She saw [place] on 11/9 and had a 6wk 2d single live IUP and then saw [place] on 12/9 for pain and bleeding.   Will do pre op transvaginal u/s to see if patient needs d&c today  Durene Romans MD Attending Center for [place] (Faculty Practice) 06/05/2019 Time: [id number]

## 2019-06-05 NOTE — Anesthesia Postprocedure Evaluation (Addendum)
Anesthesia Post Note  Patient: Percell Locus  Procedure(s) Performed: DILATATION AND EVACUATION (N/A Vagina )     Patient location during evaluation: PACU Anesthesia Type: MAC Level of consciousness: awake and alert and oriented Pain management: pain level controlled Vital Signs Assessment: post-procedure vital signs reviewed and stable Respiratory status: spontaneous breathing, nonlabored ventilation and respiratory function stable Cardiovascular status: stable and blood pressure returned to baseline Postop Assessment: no apparent nausea or vomiting Anesthetic complications: no    Last Vitals:  Vitals:   06/05/19 1645 06/05/19 1705  BP: 118/76 123/77  Pulse: 85 93  Resp: 16 18  Temp:  37.1 C  SpO2: 100% 100%    Last Pain:  Vitals:   06/05/19 1705  TempSrc: Oral  PainSc:                  Acelin Ferdig A.

## 2019-06-05 NOTE — H&P (Signed)
Obstetrics & Gynecology Surgical H&P   Date of Admission: 06/05/2019   Primary OBGYN: None Primary Care Provider: Patient, No Pcp Per  Reason for Admission: scheduled d&c  History of Present Illness: Ms. Julia Huang is a 23 y.o. G2P0020 (Patient's last menstrual period was 03/15/2019.), with the above CC. PMHx is significant for migraines.  Front desk stated that patient wanted to see me before checking in. Patient states that since we saw her in the hospital on 12/9 and dx her with retained products of conception after an elective abortion in mid November, she states she's had heavy bleeding passing clots. Nothing today and bleeding is minimal, scant and no pain, fevers, chills. She states she took abortion pills from a [facility] in mid November and she has not seen them for any follow up or ultrasounds. She saw [place] on 11/9 and had a 6wk 2d single live IUP and then saw [place] on 12/9 for pain and bleeding.   Will do pre op transvaginal u/s to see if patient needs d&c today  Update: likely rPOCs seen on TVUS    ROS: A 12-point review of systems was performed and negative, except as stated in the above HPI.  OBGYN History: As per HPI. OB History  Gravida Para Term Preterm AB Living  2 0 0 0 1    SAB TAB Ectopic Multiple Live Births  1 0 0        # Outcome Date GA Lbr Len/2nd Weight Sex Delivery Anes PTL Lv  2 Gravida           1 SAB             Past Medical History: Past Medical History:  Diagnosis Date  . Anxiety   . Asthma   . Depression     Past Surgical History: History reviewed. No pertinent surgical history.  Family History:  Family History  Problem Relation Age of Onset  . Diabetes Father   . Heart failure Father   . Mental illness Mother   . Hyperlipidemia Father   . Mental illness Father     Social History:  Social History   Socioeconomic History  . Marital status: Married    Spouse name: Not on file  . Number of children: Not on file  . Years of  education: Not on file  . Highest education level: Not on file  Occupational History  . Not on file  Tobacco Use  . Smoking status: Never Smoker  . Smokeless tobacco: Never Used  Substance and Sexual Activity  . Alcohol use: Yes    Alcohol/week: 1.0 standard drinks    Types: 1 Glasses of wine per week  . Drug use: Yes    Types: Marijuana    Comment: 05-29-19  . Sexual activity: Yes  Other Topics Concern  . Not on file  Social History Narrative   ** Merged History Encounter **       Social Determinants of Health   Financial Resource Strain:   . Difficulty of Paying Living Expenses: Not on file  Food Insecurity:   . Worried About Programme researcher, broadcasting/film/video in the Last Year: Not on file  . Ran Out of Food in the Last Year: Not on file  Transportation Needs:   . Lack of Transportation (Medical): Not on file  . Lack of Transportation (Non-Medical): Not on file  Physical Activity:   . Days of Exercise per Week: Not on file  . Minutes of Exercise per Session:  Not on file  Stress:   . Feeling of Stress : Not on file  Social Connections:   . Frequency of Communication with Friends and Family: Not on file  . Frequency of Social Gatherings with Friends and Family: Not on file  . Attends Religious Services: Not on file  . Active Member of Clubs or Organizations: Not on file  . Attends Archivist Meetings: Not on file  . Marital Status: Not on file  Intimate Partner Violence:   . Fear of Current or Ex-Partner: Not on file  . Emotionally Abused: Not on file  . Physically Abused: Not on file  . Sexually Abused: Not on file    Allergy: No Known Allergies  Current Outpatient Medications: Facility-Administered Medications Prior to Admission  Medication Dose Route Frequency Provider Last Rate Last Admin  . doxycycline (VIBRAMYCIN) 100 mg in dextrose 5 % 250 mL IVPB  100 mg Intravenous Once Aletha Halim, MD       Medications Prior to Admission  Medication Sig Dispense  Refill Last Dose  . doxycycline (VIBRA-TABS) 100 MG tablet Take 1 tablet (100 mg total) by mouth 2 (two) times daily for 10 days. 20 tablet 0 Past Week at Unknown time     Hospital Medications: Current Facility-Administered Medications  Medication Dose Route Frequency Provider Last Rate Last Admin  . lactated ringers infusion   Intravenous Continuous Hatchett, Mateo Flow, MD      . lactated ringers infusion   Intravenous Continuous Aletha Halim, MD         Physical Exam:   Current Vital Signs 24h Vital Sign Ranges  T (!) 97.5 F (36.4 C) Temp  Avg: 97.5 F (36.4 C)  Min: 97.5 F (36.4 C)  Max: 97.5 F (36.4 C)  BP 125/86 BP  Min: 125/86  Max: 125/86  HR 97 Pulse  Avg: 97  Min: 97  Max: 97  RR 18 Resp  Avg: 18  Min: 18  Max: 18  SaO2 100 % Room Air SpO2  Avg: 100 %  Min: 100 %  Max: 100 %       24 Hour I/O Current Shift I/O  Time Ins Outs No intake/output data recorded. No intake/output data recorded.   Patient Vitals for the past 24 hrs:  BP Temp Temp src Pulse Resp SpO2 Height Weight  06/05/19 1404 125/86 (!) 97.5 F (36.4 C) Tympanic 97 18 100 % 5\' 8"  (1.727 m) 92.2 kg    Body mass index is 30.91 kg/m. General appearance: Well nourished, well developed female in no acute distress.  Cardiovascular: S1, S2 normal, no murmur, rub or gallop, regular rate and rhythm Respiratory:  Clear to auscultation bilateral. Normal respiratory effort Abdomen: positive bowel sounds and no masses, hernias; diffusely non tender to palpation, non distended Neuro/Psych:  Normal mood and affect.  Skin:  Warm and dry.  Extremities: no clubbing, cyanosis, or edema.    Laboratory:  Recent Labs  Lab 05/29/19 1655  WBC 6.6  HGB 9.3*  HCT 29.6*  PLT 266   B POS  Imaging:  As per HPI  Assessment: Julia Huang is a 23 y.o. G2P0010 (Patient's last menstrual period was 03/15/2019.) with rPOCs. Pt stable  Plan: Can procced to OR   Durene Romans MD Attending Center for  [facility])

## 2019-06-05 NOTE — Anesthesia Preprocedure Evaluation (Signed)
Anesthesia Evaluation  Patient identified by MRN, date of birth, ID band Patient awake    Reviewed: Allergy & Precautions, NPO status , Patient's Chart, lab work & pertinent test results  Airway Mallampati: II  TM Distance: >3 FB Neck ROM: Full    Dental no notable dental hx. (+) Teeth Intact   Pulmonary asthma ,    Pulmonary exam normal breath sounds clear to auscultation       Cardiovascular negative cardio ROS Normal cardiovascular exam Rhythm:Regular Rate:Normal     Neuro/Psych PSYCHIATRIC DISORDERS Anxiety Depression negative neurological ROS     GI/Hepatic negative GI ROS, (+)     substance abuse  marijuana use,   Endo/Other  Obesity  Renal/GU negative Renal ROS  negative genitourinary   Musculoskeletal   Abdominal (+) + obese,   Peds  Hematology  (+) anemia ,   Anesthesia Other Findings   Reproductive/Obstetrics Retained POC                            Anesthesia Physical Anesthesia Plan  ASA: II  Anesthesia Plan: MAC   Post-op Pain Management:    Induction:   PONV Risk Score and Plan: 3 and Propofol infusion, Ondansetron, Midazolam and Treatment may vary due to age or medical condition  Airway Management Planned: Natural Airway, Nasal Cannula and Simple Face Mask  Additional Equipment:   Intra-op Plan:   Post-operative Plan:   Informed Consent: I have reviewed the patients History and Physical, chart, labs and discussed the procedure including the risks, benefits and alternatives for the proposed anesthesia with the patient or authorized representative who has indicated his/her understanding and acceptance.       Plan Discussed with: CRNA and Surgeon  Anesthesia Plan Comments:         Anesthesia Quick Evaluation

## 2019-06-06 ENCOUNTER — Encounter: Payer: Self-pay | Admitting: *Deleted

## 2019-06-07 LAB — SURGICAL PATHOLOGY

## 2019-07-12 ENCOUNTER — Telehealth: Payer: 59 | Admitting: Obstetrics and Gynecology

## 2019-07-12 ENCOUNTER — Other Ambulatory Visit: Payer: Self-pay

## 2019-07-12 DIAGNOSIS — Z91199 Patient's noncompliance with other medical treatment and regimen due to unspecified reason: Secondary | ICD-10-CM

## 2019-07-12 DIAGNOSIS — Z5329 Procedure and treatment not carried out because of patient's decision for other reasons: Secondary | ICD-10-CM

## 2019-07-12 NOTE — Progress Notes (Signed)
Patient did not keep her post-op appointment for 07/12/2019.  Cornelia Copa MD Attending Center for [employer] Midwife)

## 2019-07-12 NOTE — Progress Notes (Addendum)
Called pt at 0900; VM left stating I was calling early for patient's appt and if she would like to start early she can call the office or log into MyChart.  Called pt at 0940; VM left stating I was calling to check pt in for appt and will call again in 10 minutes.  Called pt at 0950; VM left stating pt will need to reschedule with the front office. Callback number given. Pt encouraged to call with any concerns.   Fleet Contras RN 07/12/19

## 2019-11-28 ENCOUNTER — Ambulatory Visit: Payer: 59 | Admitting: Family Medicine

## 2020-07-24 ENCOUNTER — Encounter (HOSPITAL_COMMUNITY): Payer: Self-pay

## 2020-07-24 ENCOUNTER — Emergency Department (HOSPITAL_COMMUNITY)
Admission: EM | Admit: 2020-07-24 | Discharge: 2020-07-25 | Disposition: A | Discharge status: Home / Self Care | Payer: 59 | Attending: Emergency Medicine | Admitting: Emergency Medicine

## 2020-07-24 ENCOUNTER — Other Ambulatory Visit: Payer: Self-pay

## 2020-07-24 DIAGNOSIS — S2020XA Contusion of thorax, unspecified, initial encounter: Secondary | ICD-10-CM | POA: Diagnosis not present

## 2020-07-24 DIAGNOSIS — S60221A Contusion of right hand, initial encounter: Secondary | ICD-10-CM | POA: Diagnosis not present

## 2020-07-24 DIAGNOSIS — S6991XA Unspecified injury of right wrist, hand and finger(s), initial encounter: Secondary | ICD-10-CM | POA: Diagnosis present

## 2020-07-24 DIAGNOSIS — M25511 Pain in right shoulder: Secondary | ICD-10-CM

## 2020-07-24 DIAGNOSIS — S7012XA Contusion of left thigh, initial encounter: Secondary | ICD-10-CM | POA: Diagnosis not present

## 2020-07-24 DIAGNOSIS — J45909 Unspecified asthma, uncomplicated: Secondary | ICD-10-CM | POA: Insufficient documentation
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
## 2020-07-25 ENCOUNTER — Emergency Department (HOSPITAL_COMMUNITY): Payer: 59

## 2020-07-25 MED ORDER — METHOCARBAMOL 500 MG PO TABS: 500.0000 mg | ORAL_TABLET | Freq: Two times a day (BID) | ORAL | 0 refills | Status: AC | PRN

## 2020-07-25 MED ORDER — METHOCARBAMOL 500 MG PO TABS
500.0000 mg | ORAL_TABLET | Freq: Once | ORAL | Status: AC
  Administered 2020-07-25: 500 mg via ORAL
  Filled 2020-07-25: qty 1

## 2020-07-25 MED ORDER — OXYCODONE-ACETAMINOPHEN 5-325 MG PO TABS
1.0000 | ORAL_TABLET | Freq: Once | ORAL | Status: AC
  Administered 2020-07-25: 1 via ORAL
  Filled 2020-07-25: qty 1

## 2020-07-25 MED ORDER — NAPROXEN 500 MG PO TABS
500.0000 mg | ORAL_TABLET | Freq: Once | ORAL | Status: AC
  Administered 2020-07-25: 500 mg via ORAL
  Filled 2020-07-25: qty 1

## 2020-07-25 NOTE — Discharge Instructions (Signed)
Alternate ice and heat to areas of injury 3-4 times per day to limit inflammation and spasm.  Avoid strenuous activity and heavy lifting.  We recommend consistent use of Aleve or ibuprofen for pain in addition to Robaxin for muscle spasms.  Do not drive or drink alcohol after taking Robaxin as it may make you drowsy and impair your judgment.  We recommend follow-up with a primary care doctor to ensure resolution of symptoms.  Return to the ED for any new or concerning symptoms.
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
